# Patient Record
Sex: Female | Born: 2000 | Hispanic: Yes | Marital: Single | Smoking: Never smoker
Health system: Southern US, Community
[De-identification: ages and names within clinical notes are randomized; demographics above are authoritative.]

## PROBLEM LIST (undated history)

## (undated) DIAGNOSIS — J45909 Unspecified asthma, uncomplicated: Secondary | ICD-10-CM

---
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
## 2019-11-08 ENCOUNTER — Encounter: Payer: Self-pay | Admitting: Emergency Medicine

## 2019-11-08 ENCOUNTER — Other Ambulatory Visit: Payer: Self-pay

## 2019-11-08 ENCOUNTER — Emergency Department: Payer: Medicaid Other

## 2019-11-08 ENCOUNTER — Inpatient Hospital Stay
Admission: EM | Admit: 2019-11-08 | Discharge: 2019-11-13 | DRG: 871 | Discharge status: Against Medical Advice | Payer: Medicaid Other | Attending: Internal Medicine | Admitting: Internal Medicine

## 2019-11-08 DIAGNOSIS — N920 Excessive and frequent menstruation with regular cycle: Secondary | ICD-10-CM | POA: Diagnosis present

## 2019-11-08 DIAGNOSIS — Z20828 Contact with and (suspected) exposure to other viral communicable diseases: Secondary | ICD-10-CM | POA: Diagnosis present

## 2019-11-08 DIAGNOSIS — E876 Hypokalemia: Secondary | ICD-10-CM | POA: Diagnosis present

## 2019-11-08 DIAGNOSIS — D509 Iron deficiency anemia, unspecified: Secondary | ICD-10-CM | POA: Diagnosis present

## 2019-11-08 DIAGNOSIS — R748 Abnormal levels of other serum enzymes: Secondary | ICD-10-CM | POA: Diagnosis present

## 2019-11-08 DIAGNOSIS — A084 Viral intestinal infection, unspecified: Secondary | ICD-10-CM | POA: Diagnosis present

## 2019-11-08 DIAGNOSIS — J189 Pneumonia, unspecified organism: Secondary | ICD-10-CM | POA: Diagnosis present

## 2019-11-08 DIAGNOSIS — A419 Sepsis, unspecified organism: Secondary | ICD-10-CM | POA: Diagnosis not present

## 2019-11-08 DIAGNOSIS — R197 Diarrhea, unspecified: Secondary | ICD-10-CM

## 2019-11-08 DIAGNOSIS — Z833 Family history of diabetes mellitus: Secondary | ICD-10-CM

## 2019-11-08 DIAGNOSIS — Z5329 Procedure and treatment not carried out because of patient's decision for other reasons: Secondary | ICD-10-CM | POA: Diagnosis not present

## 2019-11-08 DIAGNOSIS — R509 Fever, unspecified: Secondary | ICD-10-CM

## 2019-11-08 DIAGNOSIS — R7401 Elevation of levels of liver transaminase levels: Secondary | ICD-10-CM | POA: Diagnosis present

## 2019-11-08 HISTORY — DX: Unspecified asthma, uncomplicated: J45.909

## 2019-11-08 LAB — CBC
HCT: 37.3 % (ref 36.0–46.0)
Hemoglobin: 12 g/dL (ref 12.0–15.0)
MCH: 25 pg — ABNORMAL LOW (ref 26.0–34.0)
MCHC: 32.2 g/dL (ref 30.0–36.0)
MCV: 77.7 fL — ABNORMAL LOW (ref 80.0–100.0)
Platelets: 356 10*3/uL (ref 150–400)
RBC: 4.8 MIL/uL (ref 3.87–5.11)
RDW: 13.3 % (ref 11.5–15.5)
WBC: 15.6 10*3/uL — ABNORMAL HIGH (ref 4.0–10.5)
nRBC: 0 % (ref 0.0–0.2)

## 2019-11-08 LAB — URINALYSIS, COMPLETE (UACMP) WITH MICROSCOPIC
Bacteria, UA: NONE SEEN
Bilirubin Urine: NEGATIVE
Glucose, UA: NEGATIVE mg/dL
Hgb urine dipstick: NEGATIVE
Ketones, ur: 5 mg/dL — AB
Leukocytes,Ua: NEGATIVE
Nitrite: NEGATIVE
Protein, ur: 100 mg/dL — AB
Specific Gravity, Urine: 1.028 (ref 1.005–1.030)
pH: 6 (ref 5.0–8.0)

## 2019-11-08 LAB — COMPREHENSIVE METABOLIC PANEL
ALT: 215 U/L — ABNORMAL HIGH (ref 0–44)
AST: 251 U/L — ABNORMAL HIGH (ref 15–41)
Albumin: 4 g/dL (ref 3.5–5.0)
Alkaline Phosphatase: 181 U/L — ABNORMAL HIGH (ref 38–126)
Anion gap: 12 (ref 5–15)
BUN: 6 mg/dL (ref 6–20)
CO2: 24 mmol/L (ref 22–32)
Calcium: 9.4 mg/dL (ref 8.9–10.3)
Chloride: 99 mmol/L (ref 98–111)
Creatinine, Ser: 0.58 mg/dL (ref 0.44–1.00)
GFR calc Af Amer: >60 mL/min (ref >60)
GFR calc non Af Amer: >60 mL/min (ref >60)
Glucose, Bld: 151 mg/dL — ABNORMAL HIGH (ref 70–99)
Potassium: 2.9 mmol/L — ABNORMAL LOW (ref 3.5–5.1)
Sodium: 135 mmol/L (ref 135–145)
Total Bilirubin: 1.5 mg/dL — ABNORMAL HIGH (ref 0.3–1.2)
Total Protein: 8.8 g/dL — ABNORMAL HIGH (ref 6.5–8.1)

## 2019-11-08 LAB — POC SARS CORONAVIRUS 2 AG
SARS Coronavirus 2 Ag: NEGATIVE
SARS Coronavirus 2 Ag: NEGATIVE

## 2019-11-08 LAB — ACETAMINOPHEN LEVEL: Acetaminophen (Tylenol), Serum: 31 ug/mL — ABNORMAL HIGH (ref 10–30)

## 2019-11-08 LAB — MONONUCLEOSIS SCREEN: Mono Screen: NEGATIVE

## 2019-11-08 LAB — LACTIC ACID, PLASMA: Lactic Acid, Venous: 0.9 mmol/L (ref 0.5–1.9)

## 2019-11-08 LAB — LIPASE, BLOOD: Lipase: 37 U/L (ref 11–51)

## 2019-11-08 LAB — PREGNANCY, URINE: Preg Test, Ur: NEGATIVE

## 2019-11-08 MED ORDER — ACETAMINOPHEN 500 MG PO TABS
1000.0000 mg | ORAL_TABLET | Freq: Once | ORAL | Status: AC
  Administered 2019-11-08: 1000 mg via ORAL
  Filled 2019-11-08: qty 2

## 2019-11-08 MED ORDER — IOHEXOL 300 MG/ML  SOLN
75.0000 mL | Freq: Once | INTRAMUSCULAR | Status: AC | PRN
  Administered 2019-11-08: 75 mL via INTRAVENOUS

## 2019-11-08 MED ORDER — SODIUM CHLORIDE 0.9% FLUSH: 3.0000 mL | Freq: Once | INTRAVENOUS | Status: DC

## 2019-11-08 MED ORDER — SODIUM CHLORIDE 0.9 % IV BOLUS
2000.0000 mL | Freq: Once | INTRAVENOUS | Status: AC
  Administered 2019-11-08: 2000 mL via INTRAVENOUS

## 2019-11-08 MED ORDER — PIPERACILLIN-TAZOBACTAM 3.375 G IVPB 30 MIN
3.3750 g | Freq: Once | INTRAVENOUS | Status: AC
  Administered 2019-11-08: 3.375 g via INTRAVENOUS
  Filled 2019-11-08: qty 50

## 2019-11-08 NOTE — ED Notes (Signed)
2 sets of blood cultures, grey top on ice, and covid swab walked to lab

## 2019-11-08 NOTE — H&P (Signed)
History and Physical  Patient Name: Jacqueline [name]     URK:[id number]    DOB: December 23, 2000    DOA: 11/08/2019 PCP: Patient, No Pcp Per  Patient coming from: Home  Chief Complaint: Fever      HPI: Jacqueline [name] is a 18 y.o. F with no sig PMHx who presents with 1 week fever and abdominal discomfort.  Patient was in her usual state of health until 10 days ago when she had onset of malaise, fever, and bloating generalized abdominal discomfort (she calls this "constipation" although each night for the last several days, she takes garlic and has several watery BMs).  She has had no dysuria, urinary frequency, urgency, hematuria.  She has had no vomiting. No hematochezia.  Possible Covid contact at work, she is unsure, she did test negative for Covid the day her symptoms started. She denies cough, chest congestion, coryza, dyspnea, but also states that she has been taking NyQuil nightly for the last week.    In the ER, febrile to 102.15F on arrival, heart rate 143, blood pressure normal.  K2.9, creatinine normal.  AST and ALT to 51-15, total bilirubin 1.5.  WBC 15 point 6K, lipase normal.  Urinalysis unremarkable.  Pregnancy test negative.  Covid antigen negative.  Acetaminophen 31, but this was after being administered Tylenol in the ER (and she reports only small amount of acetaminophen and nightly NyQuil).    Chest x-ray clear, ultrasound of the liver unremarkable, CT of the abdomen and pelvis with contrast showed normal gallbladder, normal pancreas, scattered adenitis only.  No appendicitis, no constipation, no colitis.  Of note it did show patchy groundglass opacities in the imaged lung bases.  She was given empiric Zosyn for colitis and IV fluids and the hospitalist service were asked to evaluate.          ROS: Review of Systems  Constitutional: Positive for fever and malaise/fatigue. Negative for weight loss.  Respiratory: Negative for cough, hemoptysis, sputum  production, shortness of breath and wheezing.   Gastrointestinal: Positive for abdominal pain and diarrhea. Negative for blood in stool, constipation, nausea and vomiting.  Genitourinary: Negative for dysuria, flank pain, frequency, hematuria and urgency.  Musculoskeletal: Negative for back pain, falls, joint pain, myalgias and neck pain.  All other systems reviewed and are negative.         Past Medical History:  Diagnosis Date   Asthma     History reviewed. No pertinent surgical history.  Social History: Patient lives with her mother.  The patient walks unassisted.  She works at [employer].  Nonsmoker.  No alcohol.  Allergies  Allergen Reactions   Avocado Other (See Comments)    Tongue swelling     Family history: family history includes Diabetes in her mother; Pancreatitis in her mother.  Prior to Admission medications   Not on File       Physical Exam: BP 135/84    Pulse (!) 112    Temp 98.9 F (37.2 C) (Oral)    Resp (!) 22    Ht 4\' 11"  (1.499 m)    Wt 52.2 kg    LMP 10/29/2019    SpO2 99%    BMI 23.23 kg/m  General appearance: Well-developed, adult female, alert and in no acute distress.   Eyes: Anicteric, conjunctiva pink, lids and lashes normal. PERRL.    ENT: No nasal deformity, discharge, epistaxis.  Hearing normal. OP moist without lesions.   Neck: No neck masses.  Trachea  midline.  No thyromegaly/tenderness. Lymph: No cervical or supraclavicular lymphadenopathy. Skin: Warm and dry.  No jaundice.  No suspicious rashes or lesions. Cardiac: Tachycardic, regular, nl S1-S2, no murmurs appreciated.  Capillary refill is brisk.  JVP normal.  No LE edema.  Radial pulses 2+ and symmetric. Respiratory: Normal respiratory rate and rhythm.  CTAB without rales or wheezes. Abdomen: Abdomen soft.  No TTP or guarding in all 4 quadrants, no rigidity or rebound. No ascites, distension, hepatosplenomegaly.   MSK: No deformities or effusions of the large joints of the upper  or lower extremities bilaterally.  No cyanosis or clubbing. Neuro: Cranial nerves normal.  Sensation intact to light touch. Speech is fluent.  Muscle strength normal.    Psych: Sensorium intact and responding to questions, attention normal.  Behavior appropriate.  Affect guarded.  Judgment and insight appear normal.     Labs on Admission:  I have personally reviewed following labs and imaging studies: CBC: Recent Labs  Lab 11/08/19 1840  WBC 15.6*  HGB 12.0  HCT 37.3  MCV 77.7*  PLT 356   Basic Metabolic Panel: Recent Labs  Lab 11/08/19 1840  NA 135  K 2.9*  CL 99  CO2 24  GLUCOSE 151*  BUN 6  CREATININE 0.58  CALCIUM 9.4   GFR: Estimated Creatinine Clearance: 83.6 mL/min (by C-G formula based on SCr of 0.58 mg/dL).  Liver Function Tests: Recent Labs  Lab 11/08/19 1840  AST 251*  ALT 215*  ALKPHOS 181*  BILITOT 1.5*  PROT 8.8*  ALBUMIN 4.0   Recent Labs  Lab 11/08/19 1840  LIPASE 37    Sepsis Labs: Lactic acid 0.9           Radiological Exams on Admission: Personally reviewed CT abdomen and pelvis report, and ultrasound abdomen report, described above: Ct Abdomen Pelvis W Contrast  Result Date: 11/08/2019 CLINICAL DATA:  Fevers EXAM: CT ABDOMEN AND PELVIS WITH CONTRAST TECHNIQUE: Multidetector CT imaging of the abdomen and pelvis was performed using the standard protocol following bolus administration of intravenous contrast. CONTRAST:  75mL OMNIPAQUE IOHEXOL 300 MG/ML  SOLN COMPARISON:  None. FINDINGS: Lower chest: Lung bases demonstrate minimal dependent atelectatic changes. Additionally in the left lung base there is some patchy ground-glass density. This could represent atypical pneumonia. Hepatobiliary: No focal liver abnormality is seen. No gallstones, gallbladder wall thickening, or biliary dilatation. Pancreas: Unremarkable. No pancreatic ductal dilatation or surrounding inflammatory changes. Spleen: Normal in size without focal abnormality.  Adrenals/Urinary Tract: Adrenal glands are within normal limits. The kidneys are well visualized bilaterally. No renal calculi or obstructive changes are noted. The bladder is well distended. Stomach/Bowel: Colon is decompressed without inflammatory change. The appendix is air-filled and within normal limits. No inflammatory changes are seen. Small bowel and stomach are within normal limits. Vascular/Lymphatic: No vascular abnormality is seen. Scattered small right lower quadrant lymph nodes are noted which could be related to mesenteric adenitis. None of these measure over 1 cm in short axis. Reproductive: Uterus and bilateral adnexa are unremarkable. Other: No abdominal wall hernia or abnormality. No abdominopelvic ascites. Musculoskeletal: No acute or significant osseous findings. IMPRESSION: Normal-appearing appendix. Patchy ground-glass density in the left lung base which could represent atypical pneumonia. Correlation with COVID-19 testing is recommended. Scattered right lower quadrant lymphadenopathy which may be related to mesenteric adenitis. Electronically Signed   By: Alcide CleverMark  Lukens M.D.   On: 11/08/2019 21:21   Dg Chest Port 1 View  Result Date: 11/08/2019 CLINICAL DATA:  FEVER EXAM: PORTABLE  CHEST 1 VIEW COMPARISON:  07/18/2008 FINDINGS: The heart size and mediastinal contours are within normal limits. Both lungs are clear. The visualized skeletal structures are unremarkable. IMPRESSION: No active disease. Electronically Signed   By: Katherine Mantle M.D.   On: 11/08/2019 19:36   US Abdomen Limited Ruq  Result Date: 11/08/2019 CLINICAL DATA:  Right upper quadrant pain with fever for 2 weeks. EXAM: ULTRASOUND ABDOMEN LIMITED RIGHT UPPER QUADRANT COMPARISON:  None. FINDINGS: Gallbladder: No gallstones or wall thickening visualized. No sonographic Murphy sign noted by sonographer. Common bile duct: Diameter: 2 mm Liver: No focal lesion identified. Within normal limits in parenchymal echogenicity.  Portal vein is patent on color Doppler imaging with normal direction of blood flow towards the liver. Other: None. IMPRESSION: Normal right upper quadrant ultrasound. Electronically Signed   By: Amie Portland M.D.   On: 11/08/2019 20:23            Assessment/Plan  Fever Transaminitis Cause unclear.  Patient has low health literacy or is just being guarded, I am unsure: history challenging (ie no "cold symptoms" but taking NyQuil, "feels constipated" but actually has loose stools).  I think cholangitis, cholecystitis, appendicitis are ruled out by exam and imaging.  She may have a viral infection, but I do not believe sepsis.    Her only localizing features are abdominal discomfort, loose stools, transaminitis, and adenitis on imaging.    -Hold antibiotics -Check HIV -Check viral hepatitis serologies  -If these negative, will need GI consultation for further elucidation of LFTs    Atypical pneumonia on CT  Lung fields captured on CT abdomen show atypical pneumonia. Antigen test in ER negative, but given fever, diarrhea, possible exposure to COVID at work, and atypical pneumonia  -Follow COVID PCR  -Keep as PUI for now      Hypokalemia -Check magnesium -Supplement potassium      DVT prophylaxis: Lovenox  Code Status: FULL  Family Communication:   Disposition Plan: Anticipate IV fluids, follow LFTs and serial abdominal exams and hepatitis serologies Consults called:  Admission status: Inpatient     Medical decision making: Patient seen at 10:54 PM on 11/08/2019.  The patient was discussed with Dr. Mort Sawyers.  What exists of the patient's chart was reviewed in depth and summarized above.  Clinical condition: Stable.        Earl Lites Dashea Mcmullan [facility] Please page though AMION or Epic secure chat:  For password, contact charge nurse

## 2019-11-08 NOTE — ED Provider Notes (Signed)
[facility] Emergency Department Provider Note  ____________________________________________   First MD Initiated Contact with Patient 11/08/19 1857     (approximate)  I have reviewed the triage vital signs and the nursing notes.  History  Chief Complaint Fever, Constipation, and Abdominal Pain    HPI Jacqueline [name] is a 18 y.o. female who presents emergency department for intermittent fevers for approximately 1 week.  She states she did have a possible COVID exposure at work, but was subsequently tested and reportedly negative.  She reports some associated lower abdominal discomfort.  She reports prior issues with constipation, however since her mother has been giving her garlic she has been having a bowel movement daily.  Last BM was today.  She denies any diarrhea.  She denies any urinary symptoms, dysuria, hematuria.  She denies any vaginal discharge.  She denies any chest pain, shortness of breath, difficulty breathing.  She has been taking nightly NyQuil for her symptoms, otherwise no other medications, no heavy Tylenol use.   Past Medical Hx Past Medical History:  Diagnosis Date  . Asthma     Problem List There are no active problems to display for this patient.   Past Surgical Hx History reviewed. No pertinent surgical history.  Medications Prior to Admission medications   Not on File    Allergies Avocado  Family Hx No family history on file.  Social Hx Social History   Tobacco Use  . Smoking status: Never Smoker  . Smokeless tobacco: Never Used  Substance Use Topics  . Alcohol use: Not Currently  . Drug use: Not Currently     Review of Systems  Constitutional: + fever Eyes: Negative for visual changes. ENT: Negative for sore throat. Cardiovascular: Negative for chest pain. Respiratory: Negative for shortness of breath. Gastrointestinal: Negative for nausea, vomiting.  Genitourinary: Negative for dysuria.  Musculoskeletal: Negative for leg swelling. Skin: Negative for rash. Neurological: Negative for for headaches.   Physical Exam  Vital Signs: ED Triage Vitals  Enc Vitals Group     BP 11/08/19 1835 136/82     Pulse Rate 11/08/19 1835 (!) 143     Resp 11/08/19 1835 (!) 22     Temp 11/08/19 1835 (!) 102.8 F (39.3 C)     Temp Source 11/08/19 1835 Oral     SpO2 11/08/19 1835 97 %     Weight 11/08/19 1836 115 lb (52.2 kg)     Height 11/08/19 1836 4\' 11"  (1.499 m)     Head Circumference --      Peak Flow --      Pain Score 11/08/19 1835 10     Pain Loc --      Pain Edu? --      Excl. in GC? --     Constitutional: Alert and oriented.  Head: Normocephalic. Atraumatic. Eyes: Conjunctivae clear. Sclera anicteric. Nose: No congestion. No rhinorrhea. Mouth/Throat: Wearing mask.  Neck: No stridor.   Cardiovascular: Tachycardic, regular rhythm. Extremities well perfused. Respiratory: RR low 20s. No hypoxia.  Speaking in full sentences.  Lungs CTAB. Gastrointestinal: Soft. Mild RUQ and lower abdominal tenderness. No rebound or guarding. Non-distended.  Musculoskeletal: No lower extremity edema. No deformities. Neurologic:  Normal speech and language. No gross focal neurologic deficits are appreciated.  Skin: Skin is warm, dry and intact. No rash noted. Psychiatric: Mood and affect are appropriate for situation.  EKG  Personally reviewed.   Rate: 130 Rhythm: sinus Axis: normal Intervals: WNL Non-specific ST/T wave  findings Sinus tachycardia  No STEMI    Radiology  CXR: IMPRESSION:  No active disease.   [place]: IMPRESSION:  Normal right upper quadrant ultrasound.   CT: IMPRESSION:  Normal-appearing appendix.   Patchy ground-glass density in the left lung base which could  represent atypical pneumonia. Correlation with COVID-19 testing is  recommended.   Scattered right lower quadrant lymphadenopathy which may be related  to mesenteric adenitis.    Procedures   Procedure(s) performed (including critical care):  .Critical Care Performed by: Lilia Pro., MD Authorized by: Lilia Pro., MD   Critical care provider statement:    Critical care time (minutes):  45   Critical care was necessary to treat or prevent imminent or life-threatening deterioration of the following conditions:  Sepsis   Critical care was time spent personally by me on the following activities:  Discussions with consultants, evaluation of patient's response to treatment, examination of patient, ordering and performing treatments and interventions, ordering and review of laboratory studies, ordering and review of radiographic studies, pulse oximetry, re-evaluation of patient's condition, obtaining history from patient or surrogate and review of old charts     Initial Impression / Assessment and Plan / ED Course  18 y.o. female who presents to the ED for fever, abdominal pain, possible COVID exposure.   On arrival, febrile and tachycardic  Ddx: COVID, colitis, chole, UTI  Will obtain labs, imaging  Labs reveal leukocytosis to 15.6.  Elevated AST, ALT to 251, 215 respectively.  Bilirubin 1.5.  Lipase negative.  Given this, will obtain right upper quadrant ultrasound, empirically treat with Zosyn given her leukocytosis + meeting SIRS criteria.  Ultrasound is negative.  Will obtain CT abdomen/pelvis given some lower abdominal discomfort to evaluate for potential colitis.  COVID antigen negative, will proceed with follow-up PCR. Mono negative.  CT scan is negative.  Unclear etiology of her lab abnormalities and fever at this point.  However, strong suspicion for COVID remains, especially given lung bases seen on CT scan with patchy groundglass densities, which could be suggestive of COVID.  We will plan to admit, discussed with hospitalist for admission.   Final Clinical Impression(s) / ED Diagnosis  Final diagnoses:  Fever       Note:  This document was prepared  using Dragon voice recognition software and may include unintentional dictation errors.   Lilia Pro., MD 11/09/19 [phone]

## 2019-11-08 NOTE — ED Notes (Signed)
Urine pregnancy ordered for lab to complete, lab informed

## 2019-11-08 NOTE — ED Notes (Signed)
Pt otf for imaging 

## 2019-11-08 NOTE — ED Notes (Signed)
ED TO INPATIENT HANDOFF REPORT  ED Nurse Name and Phone #: Shanda Bumps [phone]   S Name/Age/Gender Jacqueline [name] 18 y.o. female Room/Bed: ED04A/ED04A  Code Status   Code Status: Not on file  Home/SNF/Other Home Patient oriented to: self, place, time and situation Is this baseline? Yes   Triage Complete: Triage complete  Chief Complaint Abd pain  Triage Note Pt presents to ED via POV with c/o fever that started last week. Pt states had a fever last week that resolved but upon arrival it is unknown whether or not has fever, in triage patient noted to be febrile at 102.8. Pt states has negative Covid test. Pt also c/o abdominal pain, denies N/V/D, states she feels constipated.     Allergies Allergies  Allergen Reactions  . Avocado Other (See Comments)    Tongue swelling     Level of Care/Admitting Diagnosis ED Disposition    ED Disposition Condition Comment   Admit  Hospital Area: [facility] [[id number]]  Level of Care: Med-Surg [16]  Covid Evaluation: Symptomatic Person Under Investigation (PUI)  Diagnosis: Fever [344092]  Admitting Physician: Alberteen Sam [[id number]]  Attending Physician: Alberteen Sam [[id number]]  Estimated length of stay: past midnight tomorrow  Certification:: I certify this patient will need inpatient services for at least 2 midnights  PT Class (Do Not Modify): Inpatient [101]  PT Acc Code (Do Not Modify): Private [1]       B Medical/Surgery History Past Medical History:  Diagnosis Date  . Asthma    History reviewed. No pertinent surgical history.   A IV Location/Drains/Wounds Patient Lines/Drains/Airways Status   Active Line/Drains/Airways    Name:   Placement date:   Placement time:   Site:   Days:   Peripheral IV 11/08/19 Left Antecubital   11/08/19    1929    Antecubital   less than 1          Intake/Output Last 24 hours No intake or output data in the 24 hours ending 11/08/19  2333  Labs/Imaging Results for orders placed or performed during the hospital encounter of 11/08/19 (from the past 48 hour(s))  Lipase, blood     Status: None   Collection Time: 11/08/19  6:40 PM  Result Value Ref Range   Lipase 37 11 - 51 U/L    Comment: Performed at [facility], [address] [place], [place] [address]  Comprehensive metabolic panel     Status: Abnormal   Collection Time: 11/08/19  6:40 PM  Result Value Ref Range   Sodium 135 135 - 145 mmol/L   Potassium 2.9 (L) 3.5 - 5.1 mmol/L   Chloride 99 98 - 111 mmol/L   CO2 24 22 - 32 mmol/L   Glucose, Bld 151 (H) 70 - 99 mg/dL   BUN 6 6 - 20 mg/dL   Creatinine, Ser 6.44 0.44 - 1.00 mg/dL   Calcium 9.4 8.9 - 03.4 mg/dL   Total Protein 8.8 (H) 6.5 - 8.1 g/dL   Albumin 4.0 3.5 - 5.0 g/dL   AST 742 (H) 15 - 41 U/L   ALT 215 (H) 0 - 44 U/L   Alkaline Phosphatase 181 (H) 38 - 126 U/L   Total Bilirubin 1.5 (H) 0.3 - 1.2 mg/dL   GFR calc non Af Amer >60 >60 mL/min   GFR calc Af Amer >60 >60 mL/min   Anion gap 12 5 - 15    Comment: Performed at [facility], [address] [place], [place] [address]  CBC     Status: Abnormal   Collection Time: 11/08/19  6:40 PM  Result Value Ref Range   WBC 15.6 (H) 4.0 - 10.5 K/uL   RBC 4.80 3.87 - 5.11 MIL/uL   Hemoglobin 12.0 12.0 - 15.0 g/dL   HCT 60.437.3 54.036.0 - 98.146.0 %   MCV 77.7 (L) 80.0 - 100.0 fL   MCH 25.0 (L) 26.0 - 34.0 pg   MCHC 32.2 30.0 - 36.0 g/dL   RDW 19.113.3 47.811.5 - 29.515.5 %   Platelets 356 150 - 400 K/uL   nRBC 0.0 0.0 - 0.2 %    Comment: Performed at [facility], [address] [place], [place] [address]  Urinalysis, Complete w Microscopic     Status: Abnormal   Collection Time: 11/08/19  7:24 PM  Result Value Ref Range   Color, Urine AMBER (A) YELLOW    Comment: BIOCHEMICALS MAY BE AFFECTED BY COLOR   APPearance HAZY (A) CLEAR   Specific Gravity, Urine 1.028 1.005 - 1.030   pH 6.0 5.0 - 8.0   Glucose, UA NEGATIVE NEGATIVE mg/dL   Hgb  urine dipstick NEGATIVE NEGATIVE   Bilirubin Urine NEGATIVE NEGATIVE   Ketones, ur 5 (A) NEGATIVE mg/dL   Protein, ur 865100 (A) NEGATIVE mg/dL   Nitrite NEGATIVE NEGATIVE   Leukocytes,Ua NEGATIVE NEGATIVE   RBC / HPF 0-5 0 - 5 RBC/hpf   WBC, UA 0-5 0 - 5 WBC/hpf   Bacteria, UA NONE SEEN NONE SEEN   Squamous Epithelial / LPF 11-20 0 - 5   Mucus PRESENT     Comment: Performed at [facility], [address] [place], [place] [address]  Pregnancy, urine     Status: None   Collection Time: 11/08/19  7:24 PM  Result Value Ref Range   Preg Test, Ur NEGATIVE NEGATIVE    Comment: Performed at [facility], [address] [place], [place] [address]  POC SARS Coronavirus 2 Ag     Status: None   Collection Time: 11/08/19  7:55 PM  Result Value Ref Range   SARS Coronavirus 2 Ag NEGATIVE NEGATIVE    Comment: (NOTE) SARS-CoV-2 antigen NOT DETECTED.  Negative results are presumptive.  Negative results do not preclude SARS-CoV-2 infection and should not be used as the sole basis for treatment or other patient management decisions, including infection  control decisions, particularly in the presence of clinical signs and  symptoms consistent with COVID-19, or in those who have been in contact with the virus.  Negative results must be combined with clinical observations, patient history, and epidemiological information. The expected result is Negative. Fact Sheet for Patients: https://sanders-williams.net/https://www.fda.gov/media/139754/download Fact Sheet for Healthcare Providers: https://martinez.com/https://www.fda.gov/media/139753/download This test is not yet approved or cleared by the [place] FDA and  has been authorized for detection and/or diagnosis of SARS-CoV-2 by FDA under an Emergency Use Authorization (EUA).  This EUA will remain in effect (meaning this test can be used) for the duration of  the COVID-19 de claration under Section 564(b)(1) of the Act, 21 U.S.C. section 360bbb-3(b)(1), unless the  authorization is terminated or revoked sooner.   POC SARS Coronavirus 2 Ag     Status: None   Collection Time: 11/08/19  7:59 PM  Result Value Ref Range   SARS Coronavirus 2 Ag NEGATIVE NEGATIVE    Comment: (NOTE) SARS-CoV-2 antigen NOT DETECTED.  Negative results are presumptive.  Negative results do not preclude SARS-CoV-2 infection and should  not be used as the sole basis for treatment or other patient management decisions, including infection  control decisions, particularly in the presence of clinical signs and  symptoms consistent with COVID-19, or in those who have been in contact with the virus.  Negative results must be combined with clinical observations, patient history, and epidemiological information. The expected result is Negative. Fact Sheet for Patients: PodPark.tn Fact Sheet for Healthcare Providers: GiftContent.is This test is not yet approved or cleared by the [place] FDA and  has been authorized for detection and/or diagnosis of SARS-CoV-2 by FDA under an Emergency Use Authorization (EUA).  This EUA will remain in effect (meaning this test can be used) for the duration of  the COVID-19 de claration under Section 564(b)(1) of the Act, 21 U.S.C. section 360bbb-3(b)(1), unless the authorization is terminated or revoked sooner.   Acetaminophen level     Status: Abnormal   Collection Time: 11/08/19  8:29 PM  Result Value Ref Range   Acetaminophen (Tylenol), Serum 31 (H) 10 - 30 ug/mL    Comment: (NOTE) Therapeutic concentrations vary significantly. A range of 10-30 ug/mL  may be an effective concentration for many patients. However, some  are best treated at concentrations outside of this range. Acetaminophen concentrations >150 ug/mL at 4 hours after ingestion  and >50 ug/mL at 12 hours after ingestion are often associated with  toxic reactions. Performed at [facility], [address] [place], [place] [address]   Mononucleosis screen     Status: None   Collection Time: 11/08/19  8:29 PM  Result Value Ref Range   Mono Screen NEGATIVE NEGATIVE    Comment: Performed at [facility], [address] [place], [place] [address]  Lactic acid, plasma     Status: None   Collection Time: 11/08/19  9:00 PM  Result Value Ref Range   Lactic Acid, Venous 0.9 0.5 - 1.9 mmol/L    Comment: Performed at [facility], [address] [place], [place] [address]   Ct Abdomen Pelvis W Contrast  Result Date: 11/08/2019 CLINICAL DATA:  Fevers EXAM: CT ABDOMEN AND PELVIS WITH CONTRAST TECHNIQUE: Multidetector CT imaging of the abdomen and pelvis was performed using the standard protocol following bolus administration of intravenous contrast. CONTRAST:  36mL OMNIPAQUE IOHEXOL 300 MG/ML  SOLN COMPARISON:  None. FINDINGS: Lower chest: Lung bases demonstrate minimal dependent atelectatic changes. Additionally in the left lung base there is some patchy ground-glass density. This could represent atypical pneumonia. Hepatobiliary: No focal liver abnormality is seen. No gallstones, gallbladder wall thickening, or biliary dilatation. Pancreas: Unremarkable. No pancreatic ductal dilatation or surrounding inflammatory changes. Spleen: Normal in size without focal abnormality. Adrenals/Urinary Tract: Adrenal glands are within normal limits. The kidneys are well visualized bilaterally. No renal calculi or obstructive changes are noted. The bladder is well distended. Stomach/Bowel: Colon is decompressed without inflammatory change. The appendix is air-filled and within normal limits. No inflammatory changes are seen. Small bowel and stomach are within normal limits. Vascular/Lymphatic: No vascular abnormality is seen. Scattered small right lower quadrant lymph nodes are noted which could be related to mesenteric adenitis. None of these measure over 1 cm in short axis. Reproductive: Uterus and  bilateral adnexa are unremarkable. Other: No abdominal wall hernia or abnormality. No abdominopelvic ascites. Musculoskeletal: No acute or significant osseous findings. IMPRESSION: Normal-appearing appendix. Patchy ground-glass density in the left lung base which could represent atypical pneumonia. Correlation with COVID-19 testing is recommended. Scattered right lower quadrant lymphadenopathy which  may be related to mesenteric adenitis. Electronically Signed   By: Alcide Clever M.D.   On: 11/08/2019 21:21   Dg Chest Port 1 View  Result Date: 11/08/2019 CLINICAL DATA:  FEVER EXAM: PORTABLE CHEST 1 VIEW COMPARISON:  07/18/2008 FINDINGS: The heart size and mediastinal contours are within normal limits. Both lungs are clear. The visualized skeletal structures are unremarkable. IMPRESSION: No active disease. Electronically Signed   By: Katherine Mantle M.D.   On: 11/08/2019 19:36   US Abdomen Limited Ruq  Result Date: 11/08/2019 CLINICAL DATA:  Right upper quadrant pain with fever for 2 weeks. EXAM: ULTRASOUND ABDOMEN LIMITED RIGHT UPPER QUADRANT COMPARISON:  None. FINDINGS: Gallbladder: No gallstones or wall thickening visualized. No sonographic Murphy sign noted by sonographer. Common bile duct: Diameter: 2 mm Liver: No focal lesion identified. Within normal limits in parenchymal echogenicity. Portal vein is patent on color Doppler imaging with normal direction of blood flow towards the liver. Other: None. IMPRESSION: Normal right upper quadrant ultrasound. Electronically Signed   By: Amie Portland M.D.   On: 11/08/2019 20:23    Pending Labs Unresulted Labs (From admission, onward)    Start     Ordered   11/08/19 2219  Urine Culture  Add-on,   AD     11/08/19 2218   11/08/19 2033  SARS CORONAVIRUS 2 (TAT 6-24 HRS) Nasopharyngeal Nasopharyngeal Swab  (Asymptomatic/Tier 3)  ONCE - STAT,   STAT    Question Answer Comment  Is this test for diagnosis or screening Screening   Symptomatic for COVID-19  as defined by CDC No   Hospitalized for COVID-19 No   Admitted to ICU for COVID-19 No   Previously tested for COVID-19 Yes   Resident in a congregate (group) care setting No   Employed in healthcare setting No   Pregnant No      11/08/19 2032   11/08/19 2031  Lactic acid, plasma  Now then every 2 hours,   STAT     11/08/19 2031   11/08/19 2031  Culture, blood (routine x 2)  BLOOD CULTURE X 2,   STAT     11/08/19 2031          Vitals/Pain Today's Vitals   11/08/19 1835 11/08/19 1836 11/08/19 1933 11/08/19 2153  BP: 136/82  135/84   Pulse: (!) 143  (!) 112   Resp: (!) 22     Temp: (!) 102.8 F (39.3 C)   98.9 F (37.2 C)  TempSrc: Oral   Oral  SpO2: 97%  99%   Weight:  52.2 kg    Height:  4\' 11"  (1.499 m)    PainSc: 10-Worst pain ever       Isolation Precautions Airborne and Contact precautions  Medications Medications  sodium chloride flush (NS) 0.9 % injection 3 mL (3 mLs Intravenous Not Given 11/08/19 1854)  sodium chloride 0.9 % bolus 2,000 mL (2,000 mLs Intravenous New Bag/Given 11/08/19 1931)  acetaminophen (TYLENOL) tablet 1,000 mg (1,000 mg Oral Given 11/08/19 1923)  piperacillin-tazobactam (ZOSYN) IVPB 3.375 g (0 g Intravenous Stopped 11/08/19 2224)  iohexol (OMNIPAQUE) 300 MG/ML solution 75 mL (75 mLs Intravenous Contrast Given 11/08/19 2049)    Mobility walks Low fall risk   Focused Assessments 1   R Recommendations: See Admitting Provider Note  Report given to:   Additional Notes:

## 2019-11-08 NOTE — ED Notes (Signed)
Ultrasound at bedside

## 2019-11-08 NOTE — ED Triage Notes (Signed)
Pt presents to ED via POV with c/o fever that started last week. Pt states had a fever last week that resolved but upon arrival it is unknown whether or not has fever, in triage patient noted to be febrile at 102.8. Pt states has negative Covid test. Pt also c/o abdominal pain, denies N/V/D, states she feels constipated.

## 2019-11-08 NOTE — ED Notes (Signed)
Red top tube sent to lab

## 2019-11-09 DIAGNOSIS — R945 Abnormal results of liver function studies: Secondary | ICD-10-CM

## 2019-11-09 DIAGNOSIS — J189 Pneumonia, unspecified organism: Secondary | ICD-10-CM

## 2019-11-09 DIAGNOSIS — R197 Diarrhea, unspecified: Secondary | ICD-10-CM

## 2019-11-09 DIAGNOSIS — E876 Hypokalemia: Secondary | ICD-10-CM

## 2019-11-09 LAB — CBC WITH DIFFERENTIAL/PLATELET
Abs Immature Granulocytes: 0.08 10*3/uL — ABNORMAL HIGH (ref 0.00–0.07)
Basophils Absolute: 0 10*3/uL (ref 0.0–0.1)
Basophils Relative: 0 %
Eosinophils Absolute: 0 10*3/uL (ref 0.0–0.5)
Eosinophils Relative: 0 %
HCT: 30.6 % — ABNORMAL LOW (ref 36.0–46.0)
Hemoglobin: 10.1 g/dL — ABNORMAL LOW (ref 12.0–15.0)
Immature Granulocytes: 1 %
Lymphocytes Relative: 8 %
Lymphs Abs: 1.2 10*3/uL (ref 0.7–4.0)
MCH: 24.8 pg — ABNORMAL LOW (ref 26.0–34.0)
MCHC: 33 g/dL (ref 30.0–36.0)
MCV: 75 fL — ABNORMAL LOW (ref 80.0–100.0)
Monocytes Absolute: 0.2 10*3/uL (ref 0.1–1.0)
Monocytes Relative: 2 %
Neutro Abs: 13.6 10*3/uL — ABNORMAL HIGH (ref 1.7–7.7)
Neutrophils Relative %: 89 %
Platelets: 317 10*3/uL (ref 150–400)
RBC: 4.08 MIL/uL (ref 3.87–5.11)
RDW: 13.8 % (ref 11.5–15.5)
WBC: 15.2 10*3/uL — ABNORMAL HIGH (ref 4.0–10.5)
nRBC: 0 % (ref 0.0–0.2)

## 2019-11-09 LAB — HEPATITIS PANEL, ACUTE
HCV Ab: NONREACTIVE
Hep A IgM: NONREACTIVE
Hep B C IgM: NONREACTIVE
Hepatitis B Surface Ag: NONREACTIVE

## 2019-11-09 LAB — BASIC METABOLIC PANEL
Anion gap: 13 (ref 5–15)
Anion gap: 10 (ref 5–15)
BUN: <5 mg/dL — ABNORMAL LOW (ref 6–20)
BUN: <5 mg/dL — ABNORMAL LOW (ref 6–20)
CO2: 22 mmol/L (ref 22–32)
CO2: 23 mmol/L (ref 22–32)
Calcium: 8.8 mg/dL — ABNORMAL LOW (ref 8.9–10.3)
Calcium: 8.7 mg/dL — ABNORMAL LOW (ref 8.9–10.3)
Chloride: 104 mmol/L (ref 98–111)
Chloride: 107 mmol/L (ref 98–111)
Creatinine, Ser: 0.45 mg/dL (ref 0.44–1.00)
Creatinine, Ser: 0.4 mg/dL — ABNORMAL LOW (ref 0.44–1.00)
GFR calc Af Amer: >60 mL/min (ref >60)
GFR calc Af Amer: >60 mL/min (ref >60)
GFR calc non Af Amer: >60 mL/min (ref >60)
GFR calc non Af Amer: >60 mL/min (ref >60)
Glucose, Bld: 109 mg/dL — ABNORMAL HIGH (ref 70–99)
Glucose, Bld: 105 mg/dL — ABNORMAL HIGH (ref 70–99)
Potassium: 2.7 mmol/L — CL (ref 3.5–5.1)
Potassium: 3.5 mmol/L (ref 3.5–5.1)
Sodium: 139 mmol/L (ref 135–145)
Sodium: 140 mmol/L (ref 135–145)

## 2019-11-09 LAB — HEPATIC FUNCTION PANEL
ALT: 176 U/L — ABNORMAL HIGH (ref 0–44)
AST: 126 U/L — ABNORMAL HIGH (ref 15–41)
Albumin: 4.1 g/dL (ref 3.5–5.0)
Alkaline Phosphatase: 176 U/L — ABNORMAL HIGH (ref 38–126)
Bilirubin, Direct: 0.4 mg/dL — ABNORMAL HIGH (ref 0.0–0.2)
Indirect Bilirubin: 1.3 mg/dL — ABNORMAL HIGH (ref 0.3–0.9)
Total Bilirubin: 1.7 mg/dL — ABNORMAL HIGH (ref 0.3–1.2)
Total Protein: 8.8 g/dL — ABNORMAL HIGH (ref 6.5–8.1)

## 2019-11-09 LAB — LACTIC ACID, PLASMA
Lactic Acid, Venous: 1 mmol/L (ref 0.5–1.9)
Lactic Acid, Venous: 1 mmol/L (ref 0.5–1.9)

## 2019-11-09 LAB — SARS CORONAVIRUS 2 (TAT 6-24 HRS): SARS Coronavirus 2: NEGATIVE

## 2019-11-09 LAB — IRON AND TIBC
Iron: 8 ug/dL — ABNORMAL LOW (ref 28–170)
Saturation Ratios: 3 % — ABNORMAL LOW (ref 10.4–31.8)
TIBC: 299 ug/dL (ref 250–450)
UIBC: 291 ug/dL

## 2019-11-09 LAB — CBC
HCT: 36.5 % (ref 36.0–46.0)
Hemoglobin: 11.7 g/dL — ABNORMAL LOW (ref 12.0–15.0)
MCH: 24.9 pg — ABNORMAL LOW (ref 26.0–34.0)
MCHC: 32.1 g/dL (ref 30.0–36.0)
MCV: 77.8 fL — ABNORMAL LOW (ref 80.0–100.0)
Platelets: 340 10*3/uL (ref 150–400)
RBC: 4.69 MIL/uL (ref 3.87–5.11)
RDW: 13.6 % (ref 11.5–15.5)
WBC: 14.7 10*3/uL — ABNORMAL HIGH (ref 4.0–10.5)
nRBC: 0 % (ref 0.0–0.2)

## 2019-11-09 LAB — URINE DRUG SCREEN, QUALITATIVE (ARMC ONLY)
Amphetamines, Ur Screen: NOT DETECTED
Barbiturates, Ur Screen: NOT DETECTED
Benzodiazepine, Ur Scrn: NOT DETECTED
Cannabinoid 50 Ng, Ur ~~LOC~~: POSITIVE — AB
Cocaine Metabolite,Ur ~~LOC~~: NOT DETECTED
MDMA (Ecstasy)Ur Screen: NOT DETECTED
Methadone Scn, Ur: NOT DETECTED
Opiate, Ur Screen: NOT DETECTED
Phencyclidine (PCP) Ur S: NOT DETECTED
Tricyclic, Ur Screen: NOT DETECTED

## 2019-11-09 LAB — VITAMIN B12: Vitamin B-12: 547 pg/mL (ref 180–914)

## 2019-11-09 LAB — MAGNESIUM
Magnesium: 1.9 mg/dL (ref 1.7–2.4)
Magnesium: 1.9 mg/dL (ref 1.7–2.4)

## 2019-11-09 LAB — FOLATE: Folate: 11.9 ng/mL (ref >5.9)

## 2019-11-09 LAB — HIV ANTIBODY (ROUTINE TESTING W REFLEX): HIV Screen 4th Generation wRfx: NONREACTIVE

## 2019-11-09 LAB — PROTIME-INR
INR: 1.1 (ref 0.8–1.2)
Prothrombin Time: 14.2 seconds (ref 11.4–15.2)

## 2019-11-09 MED ORDER — POTASSIUM CHLORIDE IN NACL 20-0.9 MEQ/L-% IV SOLN
INTRAVENOUS | Status: DC
  Administered 2019-11-09 – 2019-11-13 (×11): via INTRAVENOUS
  Filled 2019-11-09 (×17): qty 1000

## 2019-11-09 MED ORDER — PIPERACILLIN-TAZOBACTAM 3.375 G IVPB
3.3750 g | Freq: Three times a day (TID) | INTRAVENOUS | Status: DC
  Administered 2019-11-09 – 2019-11-11 (×5): 3.375 g via INTRAVENOUS
  Filled 2019-11-09 (×5): qty 50

## 2019-11-09 MED ORDER — ENOXAPARIN SODIUM 40 MG/0.4ML ~~LOC~~ SOLN
40.0000 mg | Freq: Every day | SUBCUTANEOUS | Status: DC
  Administered 2019-11-09: 40 mg via SUBCUTANEOUS
  Filled 2019-11-09 (×6): qty 0.4

## 2019-11-09 MED ORDER — POTASSIUM CHLORIDE CRYS ER 20 MEQ PO TBCR
40.0000 meq | EXTENDED_RELEASE_TABLET | Freq: Once | ORAL | Status: AC
  Administered 2019-11-09: 40 meq via ORAL
  Filled 2019-11-09: qty 2

## 2019-11-09 MED ORDER — LABETALOL HCL 5 MG/ML IV SOLN
10.0000 mg | Freq: Once | INTRAVENOUS | Status: AC
  Administered 2019-11-09: 10 mg via INTRAVENOUS
  Filled 2019-11-09: qty 4

## 2019-11-09 MED ORDER — ONDANSETRON HCL 4 MG/2ML IJ SOLN
4.0000 mg | Freq: Four times a day (QID) | INTRAMUSCULAR | Status: DC | PRN
  Administered 2019-11-09 – 2019-11-10 (×2): 4 mg via INTRAVENOUS
  Filled 2019-11-09 (×2): qty 2

## 2019-11-09 MED ORDER — IBUPROFEN 400 MG PO TABS
400.0000 mg | ORAL_TABLET | Freq: Four times a day (QID) | ORAL | Status: DC | PRN
  Administered 2019-11-09 – 2019-11-13 (×10): 400 mg via ORAL
  Filled 2019-11-09 (×10): qty 1

## 2019-11-09 MED ORDER — SODIUM CHLORIDE 0.9 % IV BOLUS
1000.0000 mL | Freq: Once | INTRAVENOUS | Status: AC
  Administered 2019-11-09: 1000 mL via INTRAVENOUS

## 2019-11-09 MED ORDER — ONDANSETRON HCL 4 MG PO TABS: 4.0000 mg | ORAL_TABLET | Freq: Four times a day (QID) | ORAL | Status: DC | PRN

## 2019-11-09 NOTE — Progress Notes (Signed)
Patients HR elevated and was called by Tele that it got as high as 140s. Stark Klein, NP on call notified. Also notified her of K of 2.7. I just gave her PO replacement and she is getting Fluids with K. Will recheck labs in morning.

## 2019-11-09 NOTE — Consult Note (Addendum)
Wyline Mood , MD [address], [place], [place], [address] [address], [place], [place], [address] Phone: [phone]  Fax: [phone]  Consultation  Referring Provider:  Dr Marylu Lund Primary Care Physician:  Patient, No Pcp Per Primary Gastroenterologist:  None          Reason for Consultation:     Abnormal liver function tests  Date of Admission:  11/08/2019 Date of Consultation:  11/09/2019         HPI:   Jacqueline [name] is a 18 y.o. female admitted with 1 week history of abdominal pain and fever.  In the ER she was febrile, tachycardic with leukocytosis.  Covid test was negative. She had a normal right upper quadrant ultrasound.  CT scan of the abdomen showed normal-appearing appendix, patchy groundglass density in the left lung base which could represent atypical pneumonia.  Scattered right lower quadrant lymphadenopathy which may be related to mesenteric adenitis.  Renal functions have been normal.  Acute hepatitis panel namely hepatitis B surface antigen, hepatitis C viral antibody, hepatitis A IgM and hepatitis B core IgM have been nonreactive.  On admission transaminases were elevated with AST of 251, ALT 215 and alkaline phosphatase of 181 and total bilirubin 1.7 today they have improved.  White cell count also has been trending down since yesterday.  I do note that the patient has a microcytic anemia.  HIV test has been negative.  She denies any new medications being commenced, herbal or OTC meds. No tylenol use , no illegal drug use of alcohol use.   Since coming into the hospital has had some diarrhea but says she feels better. No cough or shortness of breath. Feels well.   Past Medical History:  Diagnosis Date  . Asthma     History reviewed. No pertinent surgical history.  Prior to Admission medications   Not on File    Family History  Problem Relation Age of Onset  . Diabetes Mother   . Pancreatitis Mother      Social  History   Tobacco Use  . Smoking status: Never Smoker  . Smokeless tobacco: Never Used  Substance Use Topics  . Alcohol use: Not Currently  . Drug use: Not Currently    Allergies as of 11/08/2019 - Review Complete 11/08/2019  Allergen Reaction Noted  . Avocado Other (See Comments) 11/08/2019    Review of Systems:    All systems reviewed and negative except where noted in HPI.   Physical Exam:  Vital signs in last 24 hours: Temp:  [98.4 F (36.9 C)-102.8 F (39.3 C)] 98.4 F (36.9 C) (11/27 0752) Pulse Rate:  [98-143] 106 (11/27 0752) Resp:  [18-22] 20 (11/27 0752) BP: (117-138)/(70-91) 120/71 (11/27 0752) SpO2:  [95 %-99 %] 99 % (11/27 0752) Weight:  [52.2 kg] 52.2 kg (11/26 1836) Last BM Date: 11/09/19 General:   Pleasant, cooperative in NAD Head:  Normocephalic and atraumatic. Eyes:   No icterus.   Conjunctiva pink. PERRLA. Ears:  Normal auditory acuity. Neck:  Supple; no masses or thyroidomegaly Lungs: Respirations even and unlabored. Lungs clear to auscultation bilaterally.   No wheezes, crackles, or rhonchi.  Heart:  Regular rate and rhythm;  Without murmur, clicks, rubs or gallops Abdomen:  Soft, nondistended, nontender. Normal bowel sounds. No appreciable masses or hepatomegaly.  No rebound or guarding.  Neurologic:  Alert and oriented x3;  grossly normal neurologically. Skin:  Intact without significant lesions or rashes. Cervical Nodes:  No significant  cervical adenopathy. Psych:  Alert and cooperative. Normal affect.  LAB RESULTS: Recent Labs    11/08/19 1840 11/09/19 0128  WBC 15.6* 14.7*  HGB 12.0 11.7*  HCT 37.3 36.5  PLT 356 340   BMET Recent Labs    11/08/19 1840 11/09/19 0128 11/09/19 0619  NA 135 139 140  K 2.9* 2.7* 3.5  CL 99 104 107  CO2 24 22 23   GLUCOSE 151* 109* 105*  BUN 6 <5* <5*  CREATININE 0.58 0.45 0.40*  CALCIUM 9.4 8.8* 8.7*   LFT Recent Labs    11/09/19 0128  PROT 8.8*  ALBUMIN 4.1  AST 126*  ALT 176*  ALKPHOS  176*  BILITOT 1.7*  BILIDIR 0.4*  IBILI 1.3*   PT/INR No results for input(s): LABPROT, INR in the last 72 hours.  STUDIES: Ct Abdomen Pelvis W Contrast  Result Date: 11/08/2019 CLINICAL DATA:  Fevers EXAM: CT ABDOMEN AND PELVIS WITH CONTRAST TECHNIQUE: Multidetector CT imaging of the abdomen and pelvis was performed using the standard protocol following bolus administration of intravenous contrast. CONTRAST:  75mL OMNIPAQUE IOHEXOL 300 MG/ML  SOLN COMPARISON:  None. FINDINGS: Lower chest: Lung bases demonstrate minimal dependent atelectatic changes. Additionally in the left lung base there is some patchy ground-glass density. This could represent atypical pneumonia. Hepatobiliary: No focal liver abnormality is seen. No gallstones, gallbladder wall thickening, or biliary dilatation. Pancreas: Unremarkable. No pancreatic ductal dilatation or surrounding inflammatory changes. Spleen: Normal in size without focal abnormality. Adrenals/Urinary Tract: Adrenal glands are within normal limits. The kidneys are well visualized bilaterally. No renal calculi or obstructive changes are noted. The bladder is well distended. Stomach/Bowel: Colon is decompressed without inflammatory change. The appendix is air-filled and within normal limits. No inflammatory changes are seen. Small bowel and stomach are within normal limits. Vascular/Lymphatic: No vascular abnormality is seen. Scattered small right lower quadrant lymph nodes are noted which could be related to mesenteric adenitis. None of these measure over 1 cm in short axis. Reproductive: Uterus and bilateral adnexa are unremarkable. Other: No abdominal wall hernia or abnormality. No abdominopelvic ascites. Musculoskeletal: No acute or significant osseous findings. IMPRESSION: Normal-appearing appendix. Patchy ground-glass density in the left lung base which could represent atypical pneumonia. Correlation with COVID-19 testing is recommended. Scattered right lower  quadrant lymphadenopathy which may be related to mesenteric adenitis. Electronically Signed   By: Alcide CleverMark  Lukens M.D.   On: 11/08/2019 21:21   Dg Chest Port 1 View  Result Date: 11/08/2019 CLINICAL DATA:  FEVER EXAM: PORTABLE CHEST 1 VIEW COMPARISON:  07/18/2008 FINDINGS: The heart size and mediastinal contours are within normal limits. Both lungs are clear. The visualized skeletal structures are unremarkable. IMPRESSION: No active disease. Electronically Signed   By: Katherine Mantlehristopher  Green M.D.   On: 11/08/2019 19:36   [place] Abdomen Limited Ruq  Result Date: 11/08/2019 CLINICAL DATA:  Right upper quadrant pain with fever for 2 weeks. EXAM: ULTRASOUND ABDOMEN LIMITED RIGHT UPPER QUADRANT COMPARISON:  None. FINDINGS: Gallbladder: No gallstones or wall thickening visualized. No sonographic Murphy sign noted by sonographer. Common bile duct: Diameter: 2 mm Liver: No focal lesion identified. Within normal limits in parenchymal echogenicity. Portal vein is patent on color Doppler imaging with normal direction of blood flow towards the liver. Other: None. IMPRESSION: Normal right upper quadrant ultrasound. Electronically Signed   By: Amie Portlandavid  Ormond M.D.   On: 11/08/2019 20:23      Impression / Plan:   Jacqueline [name] is a 18 y.o. y/o female admitted  with a short history of fever, abdominal discomfort, leukocytosis all suggestive of an infectious process.  The LFTs were initially raised predominantly the transaminases which have been trending downwards indicating an acute process most likely either infectious or related to a toxin.  Plan 1.  Check PT/INR, continue to trend LFTs I expect him to return back to normal soon. 2.  Continue treatment for underlying sepsis.  Possible pneumonia might be the cause. 3.  If the trend does not improve tomorrow then we will check for other causes of acute hepatitis B, C, HIV, EBV CMV and HSV. 4.  Since the patient has microcytic anemia I would suggest we check her  iron studies.  Thank you for involving me in the care of this patient.      LOS: 1 day   Jonathon Bellows, MD  11/09/2019, 10:45 AM

## 2019-11-09 NOTE — Progress Notes (Signed)
Patient HR elevated in the 130's-140's MD notified, orders to increase IV fluids to 150 ml/hr.

## 2019-11-09 NOTE — Progress Notes (Addendum)
Patient c/o of weakness. Reports that she felt fine this better but now feels worse. Temperature 101.5, HR 130-150s, RR 24. MEWS of 6. Ibuprofen given. NP Ouma notified. NP at bedside for evaluation.   Update: Temperature 102.7 after ibuprofen. NP Ouma notified. NP to place orders for tylenol. HR unchanged after dose of labetalol, bolus currently infusing.

## 2019-11-09 NOTE — Progress Notes (Signed)
PROGRESS NOTE    Jacqueline [name]  KGY:[id number] DOB: 2001-07-10 DOA: 11/08/2019 PCP: Patient, No Pcp Per    Brief Narrative:  Jacqueline [name] is a 18 y.o. F with no sig PMHx who presents with 1 week fever and abdominal discomfort.  Covid test was negative.  In the ED she was febrile, tachycardic and with leukocytosis.  CT of abdomen showed normal appearing appendix, patchy groundglass density in the left lung base which could represent atypical pneumonia.  Scattered right lower quadrant lymphadenopathies related to mesenteric adenitis.  Acid was normal.Acute hepatitis panel namely hepatitis B surface antigen, hepatitis C viral antibody, hepatitis A IgM and hepatitis B core IgM have been nonreactive.  On admission transaminases were elevated with AST of 251, ALT 215 and alkaline phosphatase of 181 and total bilirubin 1.7.  HIV test has been negative.  Chest x-ray was clear, ultrasound liver was unremarkable.  She was started on empiric Zosyn for possible colitis and IV fluids and the hospitalist service was called for admission and evaluation.    Consultants:   GI  Procedures:  CT abdomen and pelvis on 11/08/2019 Normal-appearing appendix.  Patchy ground-glass density in the left lung base which could represent atypical pneumonia. Correlation with COVID-19 testing is recommended.  Scattered right lower quadrant lymphadenopathy which may be related to mesenteric adenitis.  Antimicrobials:   Zosyn   Subjective: Patient reports decreased appetite this a.m. could not eat her breakfast. Had diarrhea this am.  Denies any fever, chills, or any other symptoms.  She does report a place where she works all were tested for [employer] and was negative.  Objective: Vitals:   11/09/19 0423 11/09/19 0538 11/09/19 0554 11/09/19 0752  BP:  117/70  120/71  Pulse: (!) 109 (!) 104 98 (!) 106  Resp:  18  20  Temp:  98.6 F (37 C)  98.4 F (36.9 C)  TempSrc:  Oral    SpO2:   97%  99%  Weight:      Height:        Intake/Output Summary (Last 24 hours) at 11/09/2019 1248 Last data filed at 11/09/2019 0263 Gross per 24 hour  Intake 100 ml  Output 250 ml  Net -150 ml   Filed Weights   11/08/19 1836  Weight: 52.2 kg    Examination: , nad General exam: Appears calm and comfortable  Respiratory system: Clear to auscultation. Respiratory effort normal.  No wheeze rales rhonchi's  cardiovascular system: S1 & S2 heard, RRR. No JVD, murmurs, rubs, gallops or clicks.  Gastrointestinal system: Abdomen is nondistended, soft and nontender.  Normal bowel sounds heard. Central nervous system: Alert and oriented. No focal neurological deficits. Extremities: No edema Skin: Warm and dry Psychiatry: Judgement and insight appear normal. Mood & affect appropriate.     Data Reviewed: I have personally reviewed following labs and imaging studies  CBC: Recent Labs  Lab 11/08/19 1840 11/09/19 0128  WBC 15.6* 14.7*  HGB 12.0 11.7*  HCT 37.3 36.5  MCV 77.7* 77.8*  PLT 356 785   Basic Metabolic Panel: Recent Labs  Lab 11/08/19 1840 11/09/19 0128 11/09/19 0619  NA 135 139 140  K 2.9* 2.7* 3.5  CL 99 104 107  CO2 24 22 23   GLUCOSE 151* 109* 105*  BUN 6 <5* <5*  CREATININE 0.58 0.45 0.40*  CALCIUM 9.4 8.8* 8.7*  MG  --  1.9 1.9   GFR: Estimated Creatinine Clearance: 83.6 mL/min (A) (by C-G formula based on SCr  of 0.4 mg/dL (L)). Liver Function Tests: Recent Labs  Lab 11/08/19 1840 11/09/19 0128  AST 251* 126*  ALT 215* 176*  ALKPHOS 181* 176*  BILITOT 1.5* 1.7*  PROT 8.8* 8.8*  ALBUMIN 4.0 4.1   Recent Labs  Lab 11/08/19 1840  LIPASE 37   No results for input(s): AMMONIA in the last 168 hours. Coagulation Profile: Recent Labs  Lab 11/09/19 1136  INR 1.1   Cardiac Enzymes: No results for input(s): CKTOTAL, CKMB, CKMBINDEX, TROPONINI in the last 168 hours. BNP (last 3 results) No results for input(s): PROBNP in the last 8760  hours. HbA1C: No results for input(s): HGBA1C in the last 72 hours. CBG: No results for input(s): GLUCAP in the last 168 hours. Lipid Profile: No results for input(s): CHOL, HDL, LDLCALC, TRIG, CHOLHDL, LDLDIRECT in the last 72 hours. Thyroid Function Tests: No results for input(s): TSH, T4TOTAL, FREET4, T3FREE, THYROIDAB in the last 72 hours. Anemia Panel: Recent Labs    11/09/19 1136  FOLATE 11.9  TIBC 299  IRON 8*   Sepsis Labs: Recent Labs  Lab 11/08/19 2100 11/09/19 0128  LATICACIDVEN 0.9 1.0    Recent Results (from the past 240 hour(s))  Culture, blood (routine x 2)     Status: None (Preliminary result)   Collection Time: 11/08/19  9:00 PM   Specimen: BLOOD  Result Value Ref Range Status   Specimen Description BLOOD LEFT ARM  Final   Special Requests   Final    BOTTLES DRAWN AEROBIC AND ANAEROBIC Blood Culture adequate volume   Culture   Final    NO GROWTH < 12 HOURS Performed at [facility], [address] [place], [place] [address]    Report Status PENDING  Incomplete  SARS CORONAVIRUS 2 (TAT 6-24 HRS) Nasopharyngeal Nasopharyngeal Swab     Status: None   Collection Time: 11/08/19  9:00 PM   Specimen: Nasopharyngeal Swab  Result Value Ref Range Status   SARS Coronavirus 2 NEGATIVE NEGATIVE Final    Comment: (NOTE) SARS-CoV-2 target nucleic acids are NOT DETECTED. The SARS-CoV-2 RNA is generally detectable in upper and lower respiratory specimens during the acute phase of infection. Negative results do not preclude SARS-CoV-2 infection, do not rule out co-infections with other pathogens, and should not be used as the sole basis for treatment or other patient management decisions. Negative results must be combined with clinical observations, patient history, and epidemiological information. The expected result is Negative. Fact Sheet for Patients: HairSlick.no Fact Sheet for Healthcare  Providers: quierodirigir.com This test is not yet approved or cleared by the [place] FDA and  has been authorized for detection and/or diagnosis of SARS-CoV-2 by FDA under an Emergency Use Authorization (EUA). This EUA will remain  in effect (meaning this test can be used) for the duration of the COVID-19 declaration under Section 56 4(b)(1) of the Act, 21 U.S.C. section 360bbb-3(b)(1), unless the authorization is terminated or revoked sooner. Performed at [facility], [address], [place], [place] [address]   Culture, blood (routine x 2)     Status: None (Preliminary result)   Collection Time: 11/08/19  9:01 PM   Specimen: BLOOD  Result Value Ref Range Status   Specimen Description BLOOD LEFT ASSIST CONTROL  Final   Special Requests   Final    BOTTLES DRAWN AEROBIC AND ANAEROBIC Blood Culture adequate volume   Culture   Final    NO GROWTH < 12 HOURS Performed at [facility], [address] [place], [place] [address]    Report Status PENDING  Incomplete         Radiology Studies: Ct Abdomen Pelvis W Contrast  Result Date: 11/08/2019 CLINICAL DATA:  Fevers EXAM: CT ABDOMEN AND PELVIS WITH CONTRAST TECHNIQUE: Multidetector CT imaging of the abdomen and pelvis was performed using the standard protocol following bolus administration of intravenous contrast. CONTRAST:  54mL OMNIPAQUE IOHEXOL 300 MG/ML  SOLN COMPARISON:  None. FINDINGS: Lower chest: Lung bases demonstrate minimal dependent atelectatic changes. Additionally in the left lung base there is some patchy ground-glass density. This could represent atypical pneumonia. Hepatobiliary: No focal liver abnormality is seen. No gallstones, gallbladder wall thickening, or biliary dilatation. Pancreas: Unremarkable. No pancreatic ductal dilatation or surrounding inflammatory changes. Spleen: Normal in size without focal abnormality. Adrenals/Urinary Tract: Adrenal glands are within  normal limits. The kidneys are well visualized bilaterally. No renal calculi or obstructive changes are noted. The bladder is well distended. Stomach/Bowel: Colon is decompressed without inflammatory change. The appendix is air-filled and within normal limits. No inflammatory changes are seen. Small bowel and stomach are within normal limits. Vascular/Lymphatic: No vascular abnormality is seen. Scattered small right lower quadrant lymph nodes are noted which could be related to mesenteric adenitis. None of these measure over 1 cm in short axis. Reproductive: Uterus and bilateral adnexa are unremarkable. Other: No abdominal wall hernia or abnormality. No abdominopelvic ascites. Musculoskeletal: No acute or significant osseous findings. IMPRESSION: Normal-appearing appendix. Patchy ground-glass density in the left lung base which could represent atypical pneumonia. Correlation with COVID-19 testing is recommended. Scattered right lower quadrant lymphadenopathy which may be related to mesenteric adenitis. Electronically Signed   By: Alcide Clever M.D.   On: 11/08/2019 21:21   Dg Chest Port 1 View  Result Date: 11/08/2019 CLINICAL DATA:  FEVER EXAM: PORTABLE CHEST 1 VIEW COMPARISON:  07/18/2008 FINDINGS: The heart size and mediastinal contours are within normal limits. Both lungs are clear. The visualized skeletal structures are unremarkable. IMPRESSION: No active disease. Electronically Signed   By: Katherine Mantle M.D.   On: 11/08/2019 19:36   US Abdomen Limited Ruq  Result Date: 11/08/2019 CLINICAL DATA:  Right upper quadrant pain with fever for 2 weeks. EXAM: ULTRASOUND ABDOMEN LIMITED RIGHT UPPER QUADRANT COMPARISON:  None. FINDINGS: Gallbladder: No gallstones or wall thickening visualized. No sonographic Murphy sign noted by sonographer. Common bile duct: Diameter: 2 mm Liver: No focal lesion identified. Within normal limits in parenchymal echogenicity. Portal vein is patent on color Doppler imaging  with normal direction of blood flow towards the liver. Other: None. IMPRESSION: Normal right upper quadrant ultrasound. Electronically Signed   By: Amie Portland M.D.   On: 11/08/2019 20:23        Scheduled Meds:  enoxaparin (LOVENOX) injection  40 mg Subcutaneous QHS   Continuous Infusions:  0.9 % NaCl with KCl 20 mEq / L 100 mL/hr at 11/09/19 1227    Assessment & Plan:   Principal Problem:   Fever Active Problems:   Atypical pneumonia   Transaminitis   Fever- etiology unclear. Possibly due to atypic pna. Pending, UA culture negative so far. Will empirically treat with Zosyn for now.  Transaminitis Etiology unclear.  Possibly due to viral.  GI consulted and recommend continue treatment for underlying infection as above. Possibly may be the cause. Per GI If the trend does not improve tomorrow then we will check for other causes of acute hepatitis B, C, HIV, EBV CMV and HSV.  Atypical pneumonia on CT  covid test neg.  Per pt everyone at her job was tested also and was negative Will continue zosyn for now    Hypokalemia Replaced and improved Continue to monitor  Diarrhea Will monitor if increases will send for c.diff and stool cx.  Possibly 2/2 viral gastroenteritis.   DVT prophylaxis: lovenox Code Status: Full Family Communication: None at bedside Disposition Plan: Possible DC in a.m. if clinically stable       LOS: 1 day   Time spent: 45 minutes with more than 50% on COC    Lynn ItoSahar Gunther Zawadzki, MD [facility] Pager [phone]  If 7PM-7AM, please contact night-coverage [email or web address] Password [email or web address] 11/09/2019, 12:48 PM

## 2019-11-09 NOTE — Plan of Care (Signed)
  Problem: Education: Goal: Knowledge of [facility] Education information/materials will improve Outcome: Progressing Goal: Emotional status will improve Outcome: Progressing Goal: Mental status will improve Outcome: Progressing Goal: Verbalization of understanding the information provided will improve Outcome: Progressing   Problem: Activity: Goal: Interest or engagement in activities will improve Outcome: Progressing Goal: Sleeping patterns will improve Outcome: Progressing   Problem: Coping: Goal: Ability to verbalize frustrations and anger appropriately will improve Outcome: Progressing Goal: Ability to demonstrate self-control will improve Outcome: Progressing   Problem: Health Behavior/Discharge Planning: Goal: Identification of resources available to assist in meeting health care needs will improve Outcome: Progressing Goal: Compliance with treatment plan for underlying cause of condition will improve Outcome: Progressing   Problem: Physical Regulation: Goal: Ability to maintain clinical measurements within normal limits will improve Outcome: Progressing   Problem: Safety: Goal: Periods of time without injury will increase Outcome: Progressing   

## 2019-11-09 NOTE — ED Notes (Signed)
Report given to telemetry RN 

## 2019-11-10 ENCOUNTER — Inpatient Hospital Stay (HOSPITAL_COMMUNITY)
Admit: 2019-11-10 | Discharge: 2019-11-10 | Disposition: A | Discharge status: Home / Self Care | Payer: Medicaid Other | Attending: Internal Medicine | Admitting: Internal Medicine

## 2019-11-10 DIAGNOSIS — R9431 Abnormal electrocardiogram [ECG] [EKG]: Secondary | ICD-10-CM

## 2019-11-10 DIAGNOSIS — R7401 Elevation of levels of liver transaminase levels: Secondary | ICD-10-CM

## 2019-11-10 LAB — ECHOCARDIOGRAM COMPLETE
Height: 59 in
Weight: 1766.4 oz

## 2019-11-10 LAB — CBC
HCT: 29.5 % — ABNORMAL LOW (ref 36.0–46.0)
Hemoglobin: 9.4 g/dL — ABNORMAL LOW (ref 12.0–15.0)
MCH: 24.8 pg — ABNORMAL LOW (ref 26.0–34.0)
MCHC: 31.9 g/dL (ref 30.0–36.0)
MCV: 77.8 fL — ABNORMAL LOW (ref 80.0–100.0)
Platelets: 300 10*3/uL (ref 150–400)
RBC: 3.79 MIL/uL — ABNORMAL LOW (ref 3.87–5.11)
RDW: 13.8 % (ref 11.5–15.5)
WBC: 15.7 10*3/uL — ABNORMAL HIGH (ref 4.0–10.5)
nRBC: 0 % (ref 0.0–0.2)

## 2019-11-10 LAB — COMPREHENSIVE METABOLIC PANEL
ALT: 114 U/L — ABNORMAL HIGH (ref 0–44)
AST: 78 U/L — ABNORMAL HIGH (ref 15–41)
Albumin: 3.1 g/dL — ABNORMAL LOW (ref 3.5–5.0)
Alkaline Phosphatase: 137 U/L — ABNORMAL HIGH (ref 38–126)
Anion gap: 10 (ref 5–15)
BUN: <5 mg/dL — ABNORMAL LOW (ref 6–20)
CO2: 20 mmol/L — ABNORMAL LOW (ref 22–32)
Calcium: 8.6 mg/dL — ABNORMAL LOW (ref 8.9–10.3)
Chloride: 109 mmol/L (ref 98–111)
Creatinine, Ser: 0.49 mg/dL (ref 0.44–1.00)
GFR calc Af Amer: >60 mL/min (ref >60)
GFR calc non Af Amer: >60 mL/min (ref >60)
Glucose, Bld: 142 mg/dL — ABNORMAL HIGH (ref 70–99)
Potassium: 3.3 mmol/L — ABNORMAL LOW (ref 3.5–5.1)
Sodium: 139 mmol/L (ref 135–145)
Total Bilirubin: 1.1 mg/dL (ref 0.3–1.2)
Total Protein: 7.1 g/dL (ref 6.5–8.1)

## 2019-11-10 LAB — C DIFFICILE QUICK SCREEN W PCR REFLEX
C Diff antigen: NEGATIVE
C Diff interpretation: NOT DETECTED
C Diff toxin: NEGATIVE

## 2019-11-10 LAB — URINE CULTURE: Culture: 30000 — AB

## 2019-11-10 LAB — LACTIC ACID, PLASMA: Lactic Acid, Venous: 1.7 mmol/L (ref 0.5–1.9)

## 2019-11-10 MED ORDER — POTASSIUM CHLORIDE CRYS ER 20 MEQ PO TBCR
40.0000 meq | EXTENDED_RELEASE_TABLET | Freq: Once | ORAL | Status: AC
  Administered 2019-11-10: 40 meq via ORAL
  Filled 2019-11-10: qty 2

## 2019-11-10 MED ORDER — SODIUM CHLORIDE 0.9 % IV BOLUS
1000.0000 mL | Freq: Once | INTRAVENOUS | Status: AC
  Administered 2019-11-10: 1000 mL via INTRAVENOUS

## 2019-11-10 MED ORDER — SODIUM CHLORIDE 0.9 % IV SOLN
500.0000 mg | INTRAVENOUS | Status: DC
  Administered 2019-11-10 – 2019-11-11 (×2): 500 mg via INTRAVENOUS
  Filled 2019-11-10 (×2): qty 500

## 2019-11-10 MED ORDER — SODIUM CHLORIDE 0.9 % IV SOLN
INTRAVENOUS | Status: DC | PRN
  Administered 2019-11-10: 250 mL via INTRAVENOUS

## 2019-11-10 NOTE — Progress Notes (Signed)
*  PRELIMINARY RESULTS* Echocardiogram 2D Echocardiogram has been performed.  Jacqueline [name] 11/10/2019, 2:06 PM

## 2019-11-10 NOTE — Progress Notes (Signed)
Patient called to this RN complaining she "feels like her fever is back." This RN to bedside to assess patient, she is diaphoretic and malaise in appearance compared to this mornings assessment. VSS, ibuprofen given and cold compress applied to neck.   13:30- Patient called to this RN stating she "threw up." PRN Ondansetron given.

## 2019-11-10 NOTE — Plan of Care (Signed)
  Problem: Clinical Measurements: Goal: Diagnostic test results will improve Outcome: Progressing   Problem: Respiratory: Goal: Ability to maintain adequate ventilation will improve Outcome: Progressing   Problem: Clinical Measurements: Goal: Signs and symptoms of infection will decrease Outcome: Not Progressing Note: Patient has remained tachycardiac and febrile.

## 2019-11-10 NOTE — Progress Notes (Signed)
PROGRESS NOTE    Jacqueline [name]  ZCH:[id number] DOB: 2001-01-11 DOA: 11/08/2019 PCP: Patient, No Pcp Per    Brief Narrative:  Jacqueline [name] is a 18 y.o. F with no sig PMHx who presents with 1 week fever and abdominal discomfort.  Covid test was negative.  In the ED she was febrile, tachycardic and with leukocytosis.  CT of abdomen showed normal appearing appendix, patchy groundglass density in the left lung base which could represent atypical pneumonia.  Scattered right lower quadrant lymphadenopathies related to mesenteric adenitis.  Acid was normal.Acute hepatitis panel namely hepatitis B surface antigen, hepatitis C viral antibody, hepatitis A IgM and hepatitis B core IgM have been nonreactive.  On admission transaminases were elevated with AST of 251, ALT 215 and alkaline phosphatase of 181 and total bilirubin 1.7.  HIV test has been negative.  Chest x-ray was clear, ultrasound liver was unremarkable.  She was started on empiric Zosyn for possible colitis and IV fluids and the hospitalist service was called for admission and evaluation.    Consultants:   GI  Procedures:  CT abdomen and pelvis on 11/08/2019 Normal-appearing appendix.  Patchy ground-glass density in the left lung base which could represent atypical pneumonia. Correlation with COVID-19 testing is recommended.  Scattered right lower quadrant lymphadenopathy which may be related to mesenteric adenitis.  Antimicrobials:   Zosyn   Subjective: She was febrile and tachycardic.  Was given ibuprofen and Tylenol.  Was also given a dose of labetalol and bolus of IV fluids.   This a.m. patient reports feeling better.  Denies palpitations, shortness of breath, abdominal pain, nausea vomiting or diarrhea.  Last episode of diarrhea was yesterday morning.   Tele Sinus tach  Objective: Vitals:   11/10/19 0601 11/10/19 0631 11/10/19 0701 11/10/19 0725  BP: 137/76 (!) 144/84 131/83   Pulse: (!) 121  (!) 123 (!) 118 (!) 122  Resp:    19  Temp:    100.3 F (37.9 C)  TempSrc:    Oral  SpO2: 97% 97% 97% 97%  Weight:      Height:        Intake/Output Summary (Last 24 hours) at 11/10/2019 1057 Last data filed at 11/10/2019 1010 Gross per 24 hour  Intake 240 ml  Output 2300 ml  Net -2060 ml   Filed Weights   11/08/19 1836 11/10/19 0238  Weight: 52.2 kg 50.1 kg    Examination: General exam: Appears calm and comfortable , nad Respiratory system: Clear to auscultation, no r/w/r cardiovascular system: S1 & S2 heard, RRR. Tachycardic mildly  No JVD, murmurs, rubs, gallops or clicks.  Gastrointestinal system: Abdomen is nondistended, soft and nontender.  Normal bowel sounds heard. No rebound or guarding Central nervous system: Alert and oriented x3. No focal neurological deficits. Extremities: No edema Skin: Warm and dry Psychiatry: Judgement and insight appear normal. Mood & affect appropriate.     Data Reviewed: I have personally reviewed following labs and imaging studies  CBC: Recent Labs  Lab 11/08/19 1840 11/09/19 0128 11/09/19 2121 11/10/19 0006  WBC 15.6* 14.7* 15.2* 15.7*  NEUTROABS  --   --  13.6*  --   HGB 12.0 11.7* 10.1* 9.4*  HCT 37.3 36.5 30.6* 29.5*  MCV 77.7* 77.8* 75.0* 77.8*  PLT 356 340 317 300   Basic Metabolic Panel: Recent Labs  Lab 11/08/19 1840 11/09/19 0128 11/09/19 0619 11/10/19 0006  NA 135 139 140 139  K 2.9* 2.7* 3.5 3.3*  CL 99  104 107 109  CO2 20*  GLUCOSE 151* 109* 105* 142*  BUN 6 <5* <5* <5*  CREATININE 0.58 0.45 0.40* 0.49  CALCIUM 9.4 8.8* 8.7* 8.6*  MG  --  1.9 1.9  --    GFR: Estimated Creatinine Clearance: 77.1 mL/min (by C-G formula based on SCr of 0.49 mg/dL). Liver Function Tests: Recent Labs  Lab 11/08/19 1840 11/09/19 0128 11/10/19 0006  AST 251* 126* 78*  ALT 215* 176* 114*  ALKPHOS 181* 176* 137*  BILITOT 1.5* 1.7* 1.1  PROT 8.8* 8.8* 7.1  ALBUMIN 4.0 4.1 3.1*   Recent Labs  Lab  11/08/19 1840  LIPASE 37   No results for input(s): AMMONIA in the last 168 hours. Coagulation Profile: Recent Labs  Lab 11/09/19 1136  INR 1.1   Cardiac Enzymes: No results for input(s): CKTOTAL, CKMB, CKMBINDEX, TROPONINI in the last 168 hours. BNP (last 3 results) No results for input(s): PROBNP in the last 8760 hours. HbA1C: No results for input(s): HGBA1C in the last 72 hours. CBG: No results for input(s): GLUCAP in the last 168 hours. Lipid Profile: No results for input(s): CHOL, HDL, LDLCALC, TRIG, CHOLHDL, LDLDIRECT in the last 72 hours. Thyroid Function Tests: No results for input(s): TSH, T4TOTAL, FREET4, T3FREE, THYROIDAB in the last 72 hours. Anemia Panel: Recent Labs    11/09/19 1136  VITAMINB12 547  FOLATE 11.9  TIBC 299  IRON 8*   Sepsis Labs: Recent Labs  Lab 11/08/19 2100 11/09/19 0128 11/09/19 2121 11/10/19 0005  LATICACIDVEN 0.9 1.0 1.0 1.7    Recent Results (from the past 240 hour(s))  Culture, blood (routine x 2)     Status: None (Preliminary result)   Collection Time: 11/08/19  9:00 PM   Specimen: BLOOD  Result Value Ref Range Status   Specimen Description BLOOD LEFT ARM  Final   Special Requests   Final    BOTTLES DRAWN AEROBIC AND ANAEROBIC Blood Culture adequate volume   Culture   Final    NO GROWTH 2 DAYS Performed at [facility], [address], [place], [place] [address]    Report Status PENDING  Incomplete  SARS CORONAVIRUS 2 (TAT 6-24 HRS) Nasopharyngeal Nasopharyngeal Swab     Status: None   Collection Time: 11/08/19  9:00 PM   Specimen: Nasopharyngeal Swab  Result Value Ref Range Status   SARS Coronavirus 2 NEGATIVE NEGATIVE Final    Comment: (NOTE) SARS-CoV-2 target nucleic acids are NOT DETECTED. The SARS-CoV-2 RNA is generally detectable in upper and lower respiratory specimens during the acute phase of infection. Negative results do not preclude SARS-CoV-2 infection, do not rule out co-infections with  other pathogens, and should not be used as the sole basis for treatment or other patient management decisions. Negative results must be combined with clinical observations, patient history, and epidemiological information. The expected result is Negative. Fact Sheet for Patients: HairSlick.no Fact Sheet for Healthcare Providers: quierodirigir.com This test is not yet approved or cleared by the [place] FDA and  has been authorized for detection and/or diagnosis of SARS-CoV-2 by FDA under an Emergency Use Authorization (EUA). This EUA will remain  in effect (meaning this test can be used) for the duration of the COVID-19 declaration under Section 56 4(b)(1) of the Act, 21 U.S.C. section 360bbb-3(b)(1), unless the authorization is terminated or revoked sooner. Performed at [facility], [address], [place], [place] [address]   Culture, blood (routine x 2)  Status: None (Preliminary result)   Collection Time: 11/08/19  9:01 PM   Specimen: BLOOD  Result Value Ref Range Status   Specimen Description BLOOD LEFT ASSIST CONTROL  Final   Special Requests   Final    BOTTLES DRAWN AEROBIC AND ANAEROBIC Blood Culture adequate volume   Culture   Final    NO GROWTH 2 DAYS Performed at [facility], [address], [place], [place] [address]    Report Status PENDING  Incomplete         Radiology Studies: Ct Abdomen Pelvis W Contrast  Result Date: 11/08/2019 CLINICAL DATA:  Fevers EXAM: CT ABDOMEN AND PELVIS WITH CONTRAST TECHNIQUE: Multidetector CT imaging of the abdomen and pelvis was performed using the standard protocol following bolus administration of intravenous contrast. CONTRAST:  7mL OMNIPAQUE IOHEXOL 300 MG/ML  SOLN COMPARISON:  None. FINDINGS: Lower chest: Lung bases demonstrate minimal dependent atelectatic changes. Additionally in the left lung base there is some patchy ground-glass density. This  could represent atypical pneumonia. Hepatobiliary: No focal liver abnormality is seen. No gallstones, gallbladder wall thickening, or biliary dilatation. Pancreas: Unremarkable. No pancreatic ductal dilatation or surrounding inflammatory changes. Spleen: Normal in size without focal abnormality. Adrenals/Urinary Tract: Adrenal glands are within normal limits. The kidneys are well visualized bilaterally. No renal calculi or obstructive changes are noted. The bladder is well distended. Stomach/Bowel: Colon is decompressed without inflammatory change. The appendix is air-filled and within normal limits. No inflammatory changes are seen. Small bowel and stomach are within normal limits. Vascular/Lymphatic: No vascular abnormality is seen. Scattered small right lower quadrant lymph nodes are noted which could be related to mesenteric adenitis. None of these measure over 1 cm in short axis. Reproductive: Uterus and bilateral adnexa are unremarkable. Other: No abdominal wall hernia or abnormality. No abdominopelvic ascites. Musculoskeletal: No acute or significant osseous findings. IMPRESSION: Normal-appearing appendix. Patchy ground-glass density in the left lung base which could represent atypical pneumonia. Correlation with COVID-19 testing is recommended. Scattered right lower quadrant lymphadenopathy which may be related to mesenteric adenitis. Electronically Signed   By: Inez Catalina M.D.   On: 11/08/2019 21:21   Dg Chest Port 1 View  Result Date: 11/08/2019 CLINICAL DATA:  FEVER EXAM: PORTABLE CHEST 1 VIEW COMPARISON:  07/18/2008 FINDINGS: The heart size and mediastinal contours are within normal limits. Both lungs are clear. The visualized skeletal structures are unremarkable. IMPRESSION: No active disease. Electronically Signed   By: Constance Holster M.D.   On: 11/08/2019 19:36   US Abdomen Limited Ruq  Result Date: 11/08/2019 CLINICAL DATA:  Right upper quadrant pain with fever for 2 weeks. EXAM:  ULTRASOUND ABDOMEN LIMITED RIGHT UPPER QUADRANT COMPARISON:  None. FINDINGS: Gallbladder: No gallstones or wall thickening visualized. No sonographic Murphy sign noted by sonographer. Common bile duct: Diameter: 2 mm Liver: No focal lesion identified. Within normal limits in parenchymal echogenicity. Portal vein is patent on color Doppler imaging with normal direction of blood flow towards the liver. Other: None. IMPRESSION: Normal right upper quadrant ultrasound. Electronically Signed   By: Lajean Manes M.D.   On: 11/08/2019 20:23        Scheduled Meds: . enoxaparin (LOVENOX) injection  40 mg Subcutaneous QHS   Continuous Infusions: . sodium chloride 250 mL (11/10/19 0709)  . 0.9 % NaCl with KCl 20 mEq / L 150 mL/hr at 11/10/19 0705  . piperacillin-tazobactam 3.375 g (11/10/19 0710)    Assessment & Plan:   Principal Problem:   Fever Active Problems:  Atypical pneumonia   Transaminitis   Hypokalemia   Diarrhea   Fever/sepsis- etiology unclear. Possibly due to atypic pna. Tachycardic and leukocytosis.   Blood cultures urine cultures so far negative final results pending On Zosyn empirically.  Will add azithromycin IV.  Tachycardia-appears to be sinus tachycardia on telemetry. Possibly due to underlying infection/sepsis. Continue supportive care We will check echo  Transaminitis Etiology unclear.  Possibly due to viral/pneumonia.  GI consulted and recommend continue treatment for underlying infection as above. Possibly may be the cause. LFTs trending down Per GI If the trend does not improve then we will check for other causes of acute hepatitis B, C, HIV, EBV CMV and HSV.-Hold off for now      Atypical pneumonia on CT  covid test neg.  Per pt everyone at her job was tested also and was negative Will continue zosyn.  We will add azithromycin IV    Hypokalemia Continue to replace Continue to monitor  Diarrhea Improved, none reported Possibly 2/2 viral  gastroenteritis.?   DVT prophylaxis: lovenox Code Status: Full Family Communication: None at bedside Disposition Plan: Possible DC in a.m. if clinically stable       LOS: 2 days   Time spent: 45 minutes with more than 50% on COC    Lynn ItoSahar Sian Joles, MD [facility] Pager [phone]  If 7PM-7AM, please contact night-coverage [email or web address] Password [email or web address] 11/10/2019, 10:57 AM Patient ID: Brock Raarolina L Urrutia Perdomo, female   DOB: 11/08/2001, 18 y.o.   MRN: [id number]

## 2019-11-10 NOTE — Progress Notes (Signed)
Jonathon Bellows , MD [address] [place], [place], [place], [address] [address] [place], [place], [place], [address] Phone: [phone]  Fax: [phone]   Archie Balboa Madelynn Done Perdomo is being followed for abnormal LFTs day 1 of follow up   Subjective:  Denies any complaints , feels ok    Objective: Vital signs in last 24 hours: Vitals:   11/10/19 0601 11/10/19 0631 11/10/19 0701 11/10/19 0725  BP: 137/76 (!) 144/84 131/83   Pulse: (!) 121 (!) 123 (!) 118 (!) 122  Resp:    19  Temp:    100.3 F (37.9 C)  TempSrc:    Oral  SpO2: 97% 97% 97% 97%  Weight:      Height:       Weight change: -2.087 kg  Intake/Output Summary (Last 24 hours) at 11/10/2019 1248 Last data filed at 11/10/2019 1100 Gross per 24 hour  Intake 240 ml  Output 2600 ml  Net -2360 ml     Exam: Heart:: Regular rate and rhythm, S1S2 present or without murmur or extra heart sounds Lungs: normal, clear to auscultation and clear to auscultation and percussion Abdomen: soft, nontender, normal bowel sounds   Lab Results: @LABTEST2 @ Micro Results: Recent Results (from the past 240 hour(s))  Culture, blood (routine x 2)     Status: None (Preliminary result)   Collection Time: 11/08/19  9:00 PM   Specimen: BLOOD  Result Value Ref Range Status   Specimen Description BLOOD LEFT ARM  Final   Special Requests   Final    BOTTLES DRAWN AEROBIC AND ANAEROBIC Blood Culture adequate volume   Culture   Final    NO GROWTH 2 DAYS Performed at [facility], [address] [place], [place] [address]    Report Status PENDING  Incomplete  SARS CORONAVIRUS 2 (TAT 6-24 HRS) Nasopharyngeal Nasopharyngeal Swab     Status: None   Collection Time: 11/08/19  9:00 PM   Specimen: Nasopharyngeal Swab  Result Value Ref Range Status   SARS Coronavirus 2 NEGATIVE NEGATIVE Final    Comment: (NOTE) SARS-CoV-2 target nucleic acids are NOT DETECTED. The SARS-CoV-2 RNA is generally detectable in upper and  lower respiratory specimens during the acute phase of infection. Negative results do not preclude SARS-CoV-2 infection, do not rule out co-infections with other pathogens, and should not be used as the sole basis for treatment or other patient management decisions. Negative results must be combined with clinical observations, patient history, and epidemiological information. The expected result is Negative. Fact Sheet for Patients: SugarRoll.be Fact Sheet for Healthcare Providers: https://www.woods-mathews.com/ This test is not yet approved or cleared by the [place] FDA and  has been authorized for detection and/or diagnosis of SARS-CoV-2 by FDA under an Emergency Use Authorization (EUA). This EUA will remain  in effect (meaning this test can be used) for the duration of the COVID-19 declaration under Section 56 4(b)(1) of the Act, 21 U.S.C. section 360bbb-3(b)(1), unless the authorization is terminated or revoked sooner. Performed at [facility], [place] [address] [place], [place] [address]   Culture, blood (routine x 2)     Status: None (Preliminary result)   Collection Time: 11/08/19  9:01 PM   Specimen: BLOOD  Result Value Ref Range Status   Specimen Description BLOOD LEFT ASSIST CONTROL  Final   Special Requests   Final    BOTTLES DRAWN AEROBIC AND ANAEROBIC Blood Culture adequate volume   Culture   Final  NO GROWTH 2 DAYS Performed at [facility], [address] [place], [place] [address]    Report Status PENDING  Incomplete  C difficile quick scan w PCR reflex     Status: None   Collection Time: 11/09/19 12:54 PM   Specimen: STOOL  Result Value Ref Range Status   C Diff antigen NEGATIVE NEGATIVE Final   C Diff toxin NEGATIVE NEGATIVE Final   C Diff interpretation No C. difficile detected.  Final    Comment: Performed at [facility], [address] [place], [place] [address]    Studies/Results: Ct Abdomen Pelvis W Contrast  Result Date: 11/08/2019 CLINICAL DATA:  Fevers EXAM: CT ABDOMEN AND PELVIS WITH CONTRAST TECHNIQUE: Multidetector CT imaging of the abdomen and pelvis was performed using the standard protocol following bolus administration of intravenous contrast. CONTRAST:  99mL OMNIPAQUE IOHEXOL 300 MG/ML  SOLN COMPARISON:  None. FINDINGS: Lower chest: Lung bases demonstrate minimal dependent atelectatic changes. Additionally in the left lung base there is some patchy ground-glass density. This could represent atypical pneumonia. Hepatobiliary: No focal liver abnormality is seen. No gallstones, gallbladder wall thickening, or biliary dilatation. Pancreas: Unremarkable. No pancreatic ductal dilatation or surrounding inflammatory changes. Spleen: Normal in size without focal abnormality. Adrenals/Urinary Tract: Adrenal glands are within normal limits. The kidneys are well visualized bilaterally. No renal calculi or obstructive changes are noted. The bladder is well distended. Stomach/Bowel: Colon is decompressed without inflammatory change. The appendix is air-filled and within normal limits. No inflammatory changes are seen. Small bowel and stomach are within normal limits. Vascular/Lymphatic: No vascular abnormality is seen. Scattered small right lower quadrant lymph nodes are noted which could be related to mesenteric adenitis. None of these measure over 1 cm in short axis. Reproductive: Uterus and bilateral adnexa are unremarkable. Other: No abdominal wall hernia or abnormality. No abdominopelvic ascites. Musculoskeletal: No acute or significant osseous findings. IMPRESSION: Normal-appearing appendix. Patchy ground-glass density in the left lung base which could represent atypical pneumonia. Correlation with COVID-19 testing is recommended. Scattered right lower quadrant lymphadenopathy which may be related to mesenteric adenitis. Electronically Signed   By: Alcide Clever  M.D.   On: 11/08/2019 21:21   Dg Chest Port 1 View  Result Date: 11/08/2019 CLINICAL DATA:  FEVER EXAM: PORTABLE CHEST 1 VIEW COMPARISON:  07/18/2008 FINDINGS: The heart size and mediastinal contours are within normal limits. Both lungs are clear. The visualized skeletal structures are unremarkable. IMPRESSION: No active disease. Electronically Signed   By: Katherine Mantle M.D.   On: 11/08/2019 19:36   US Abdomen Limited Ruq  Result Date: 11/08/2019 CLINICAL DATA:  Right upper quadrant pain with fever for 2 weeks. EXAM: ULTRASOUND ABDOMEN LIMITED RIGHT UPPER QUADRANT COMPARISON:  None. FINDINGS: Gallbladder: No gallstones or wall thickening visualized. No sonographic Murphy sign noted by sonographer. Common bile duct: Diameter: 2 mm Liver: No focal lesion identified. Within normal limits in parenchymal echogenicity. Portal vein is patent on color Doppler imaging with normal direction of blood flow towards the liver. Other: None. IMPRESSION: Normal right upper quadrant ultrasound. Electronically Signed   By: Amie Portland M.D.   On: 11/08/2019 20:23   Medications: I have reviewed the patient's current medications. Scheduled Meds:  enoxaparin (LOVENOX) injection  40 mg Subcutaneous QHS   Continuous Infusions:  sodium chloride 250 mL (11/10/19 0709)   0.9 % NaCl with KCl 20 mEq / L 150 mL/hr at 11/10/19 0705   azithromycin 500 mg (11/10/19 1143)   piperacillin-tazobactam 3.375 g (11/10/19  0710)   PRN Meds:.sodium chloride, ibuprofen, ondansetron **OR** ondansetron (ZOFRAN) IV   Assessment: Principal Problem:   Fever Active Problems:   Atypical pneumonia   Transaminitis   Hypokalemia   Diarrhea  Eliah L Shyrl NumbersUrrutia Perdomo is a 18 y.o. y/o female admitted with a short history of fever, abdominal discomfort, leukocytosis all suggestive of an infectious process.  The LFTs were initially raised predominantly the transaminases which have been trending downwards indicating an acute  process most likely either infectious or related to a toxin.  Could be related to her pneumonia.  Symptoms have significantly better than yesterday I do not think this requires any further evaluation.  Iron studies show iron deficiency.  Says she has very heavy periods, suggest iron replacement       LOS: 2 days   Wyline MoodKiran Lacharles Altschuler, MD 11/10/2019, 12:48 PM

## 2019-11-11 DIAGNOSIS — R000 Tachycardia, unspecified: Secondary | ICD-10-CM

## 2019-11-11 LAB — CBC
HCT: 29.1 % — ABNORMAL LOW (ref 36.0–46.0)
Hemoglobin: 9.6 g/dL — ABNORMAL LOW (ref 12.0–15.0)
MCH: 25.1 pg — ABNORMAL LOW (ref 26.0–34.0)
MCHC: 33 g/dL (ref 30.0–36.0)
MCV: 76 fL — ABNORMAL LOW (ref 80.0–100.0)
Platelets: 322 10*3/uL (ref 150–400)
RBC: 3.83 MIL/uL — ABNORMAL LOW (ref 3.87–5.11)
RDW: 14.2 % (ref 11.5–15.5)
WBC: 15.9 10*3/uL — ABNORMAL HIGH (ref 4.0–10.5)
nRBC: 0 % (ref 0.0–0.2)

## 2019-11-11 LAB — COMPREHENSIVE METABOLIC PANEL
ALT: 128 U/L — ABNORMAL HIGH (ref 0–44)
AST: 117 U/L — ABNORMAL HIGH (ref 15–41)
Albumin: 3.1 g/dL — ABNORMAL LOW (ref 3.5–5.0)
Alkaline Phosphatase: 182 U/L — ABNORMAL HIGH (ref 38–126)
Anion gap: 11 (ref 5–15)
BUN: <5 mg/dL — ABNORMAL LOW (ref 6–20)
CO2: 18 mmol/L — ABNORMAL LOW (ref 22–32)
Calcium: 8.6 mg/dL — ABNORMAL LOW (ref 8.9–10.3)
Chloride: 110 mmol/L (ref 98–111)
Creatinine, Ser: 0.38 mg/dL — ABNORMAL LOW (ref 0.44–1.00)
GFR calc Af Amer: >60 mL/min (ref >60)
GFR calc non Af Amer: >60 mL/min (ref >60)
Glucose, Bld: 110 mg/dL — ABNORMAL HIGH (ref 70–99)
Potassium: 3.7 mmol/L (ref 3.5–5.1)
Sodium: 139 mmol/L (ref 135–145)
Total Bilirubin: 1.1 mg/dL (ref 0.3–1.2)
Total Protein: 7.5 g/dL (ref 6.5–8.1)

## 2019-11-11 NOTE — Progress Notes (Signed)
PROGRESS NOTE    Jacqueline [name]  ZOX:[id number] DOB: [birth year] DOA: 11/08/2019 PCP: Patient, No Pcp Per    Brief Narrative:  Jacqueline [name] is a 18 y.o. F with no sig PMHx who presents with 1 week fever and abdominal discomfort.  Covid test was negative.  In the ED she was febrile, tachycardic and with leukocytosis.  CT of abdomen showed normal appearing appendix, patchy groundglass density in the left lung base which could represent atypical pneumonia.  Scattered right lower quadrant lymphadenopathies related to mesenteric adenitis.  Acid was normal.Acute hepatitis panel namely hepatitis B surface antigen, hepatitis C viral antibody, hepatitis A IgM and hepatitis B core IgM have been nonreactive.  On admission transaminases were elevated with AST of 251, ALT 215 and alkaline phosphatase of 181 and total bilirubin 1.7.  HIV test has been negative.  Chest x-ray was clear, ultrasound liver was unremarkable.  She was started on empiric Zosyn for possible colitis and IV fluids and the hospitalist service was called for admission and evaluation.    Consultants:   GI  Procedures:  CT abdomen and pelvis on 11/08/2019 Normal-appearing appendix. Patchy ground-glass density in the left lung base which could represent atypical pneumonia. Correlation with COVID-19 testing is recommended. Scattered right lower quadrant lymphadenopathy which may be related to mesenteric adenitis.  Echo 11/28 1. Left ventricular ejection fraction, by visual estimation, is 60 to 65%. The left ventricle has normal function. There is no left ventricular hypertrophy.  2. Global right ventricle has normal systolic function.The right ventricular size is normal. No increase in right ventricular wall thickness.  3. TR signal is inadequate for assessing pulmonary artery systolic pressure.  Antimicrobials:   Zosyn   Subjective: She was febrile and tachycardic intermittently again . Tmax 101  Tele , sinus tach Pt denies nausea, vomiting, shortness of breath, dizziness, abdominal pain   Objective: Vitals:   11/11/19 0041 11/11/19 0523 11/11/19 0800 11/11/19 0925  BP:  (!) 128/91 132/84 130/76  Pulse: (!) 114 (!) 127 (!) 143 (!) 135  Resp:  20 19 20   Temp:  98.4 F (36.9 C) (!) 100.4 F (38 C) 99.8 F (37.7 C)  TempSrc:  Oral Oral Oral  SpO2:  97% 96% 96%  Weight:  50.3 kg    Height:        Intake/Output Summary (Last 24 hours) at 11/11/2019 1344 Last data filed at 11/11/2019 1300 Gross per 24 hour  Intake 7886.39 ml  Output 4200 ml  Net 3686.39 ml   Filed Weights   11/08/19 1836 11/10/19 0238 11/11/19 0523  Weight: 52.2 kg 50.1 kg 50.3 kg    Examination: General exam: Appears tired, NAD Respiratory system: Clear to auscultation, no r/w/r cardiovascular system: S1 & S2 heard, RRR. Tachycardic  No JVD, murmurs, rubs, gallops or clicks.  Gastrointestinal system: Abdomen is nondistended, soft and nontender.  +bs No rebound or guarding Central nervous system: Alert and oriented x3. No focal neurological deficits. Extremities: No edema or cyanosis Skin: Warm and dry Psychiatry: Judgement and insight appear normal. Mood & affect appropriate.     Data Reviewed: I have personally reviewed following labs and imaging studies  CBC: Recent Labs  Lab 11/08/19 1840 11/09/19 0128 11/09/19 2121 11/10/19 0006 11/11/19 0526  WBC 15.6* 14.7* 15.2* 15.7* 15.9*  NEUTROABS  --   --  13.6*  --   --   HGB 12.0 11.7* 10.1* 9.4* 9.6*  HCT 37.3 36.5 30.6* 29.5* 29.1*  MCV 77.7*  77.8* 75.0* 77.8* 76.0*  PLT 356 340 317 300 322   Basic Metabolic Panel: Recent Labs  Lab 11/08/19 1840 11/09/19 0128 11/09/19 0619 11/10/19 0006 11/11/19 0526  NA 135 139 140 139 139  K 2.9* 2.7* 3.5 3.3* 3.7  CL 99 104 107 109 110  CO2 24 22 23  20* 18*  GLUCOSE 151* 109* 105* 142* 110*  BUN 6 <5* <5* <5* <5*  CREATININE 0.58 0.45 0.40* 0.49 0.38*  CALCIUM 9.4 8.8* 8.7* 8.6* 8.6*   MG  --  1.9 1.9  --   --    GFR: Estimated Creatinine Clearance: 77.1 mL/min (A) (by C-G formula based on SCr of 0.38 mg/dL (L)). Liver Function Tests: Recent Labs  Lab 11/08/19 1840 11/09/19 0128 11/10/19 0006 11/11/19 0526  AST 251* 126* 78* 117*  ALT 215* 176* 114* 128*  ALKPHOS 181* 176* 137* 182*  BILITOT 1.5* 1.7* 1.1 1.1  PROT 8.8* 8.8* 7.1 7.5  ALBUMIN 4.0 4.1 3.1* 3.1*   Recent Labs  Lab 11/08/19 1840  LIPASE 37   No results for input(s): AMMONIA in the last 168 hours. Coagulation Profile: Recent Labs  Lab 11/09/19 1136  INR 1.1   Cardiac Enzymes: No results for input(s): CKTOTAL, CKMB, CKMBINDEX, TROPONINI in the last 168 hours. BNP (last 3 results) No results for input(s): PROBNP in the last 8760 hours. HbA1C: No results for input(s): HGBA1C in the last 72 hours. CBG: No results for input(s): GLUCAP in the last 168 hours. Lipid Profile: No results for input(s): CHOL, HDL, LDLCALC, TRIG, CHOLHDL, LDLDIRECT in the last 72 hours. Thyroid Function Tests: No results for input(s): TSH, T4TOTAL, FREET4, T3FREE, THYROIDAB in the last 72 hours. Anemia Panel: Recent Labs    11/09/19 1136  VITAMINB12 547  FOLATE 11.9  TIBC 299  IRON 8*   Sepsis Labs: Recent Labs  Lab 11/08/19 2100 11/09/19 0128 11/09/19 2121 11/10/19 0005  LATICACIDVEN 0.9 1.0 1.0 1.7    Recent Results (from the past 240 hour(s))  Urine Culture     Status: Abnormal   Collection Time: 11/08/19  7:24 PM   Specimen: Urine, Random  Result Value Ref Range Status   Specimen Description   Final    URINE, RANDOM Performed at [facility], [address] [place], [place] [address]    Special Requests   Final    NONE Performed at [facility], [address] [place], [place] [address]    Culture (A)  Final    30,000 COLONIES/mL MULTIPLE SPECIES PRESENT, SUGGEST RECOLLECTION   Report Status 11/10/2019 FINAL  Final  Culture, blood (routine x 2)     Status: None  (Preliminary result)   Collection Time: 11/08/19  9:00 PM   Specimen: BLOOD  Result Value Ref Range Status   Specimen Description BLOOD LEFT ARM  Final   Special Requests   Final    BOTTLES DRAWN AEROBIC AND ANAEROBIC Blood Culture adequate volume   Culture   Final    NO GROWTH 3 DAYS Performed at [facility], [address] [place], [place] [address]    Report Status PENDING  Incomplete  SARS CORONAVIRUS 2 (TAT 6-24 HRS) Nasopharyngeal Nasopharyngeal Swab     Status: None   Collection Time: 11/08/19  9:00 PM   Specimen: Nasopharyngeal Swab  Result Value Ref Range Status   SARS Coronavirus 2 NEGATIVE NEGATIVE Final    Comment: (NOTE) SARS-CoV-2 target nucleic acids are NOT DETECTED. The SARS-CoV-2 RNA is generally detectable  in upper and lower respiratory specimens during the acute phase of infection. Negative results do not preclude SARS-CoV-2 infection, do not rule out co-infections with other pathogens, and should not be used as the sole basis for treatment or other patient management decisions. Negative results must be combined with clinical observations, patient history, and epidemiological information. The expected result is Negative. Fact Sheet for Patients: HairSlick.no Fact Sheet for Healthcare Providers: quierodirigir.com This test is not yet approved or cleared by the [place] FDA and  has been authorized for detection and/or diagnosis of SARS-CoV-2 by FDA under an Emergency Use Authorization (EUA). This EUA will remain  in effect (meaning this test can be used) for the duration of the COVID-19 declaration under Section 56 4(b)(1) of the Act, 21 U.S.C. section 360bbb-3(b)(1), unless the authorization is terminated or revoked sooner. Performed at [facility], [address], [place], [place] [address]   Culture, blood (routine x 2)     Status: None (Preliminary result)   Collection Time:  11/08/19  9:01 PM   Specimen: BLOOD  Result Value Ref Range Status   Specimen Description BLOOD LEFT ASSIST CONTROL  Final   Special Requests   Final    BOTTLES DRAWN AEROBIC AND ANAEROBIC Blood Culture adequate volume   Culture   Final    NO GROWTH 3 DAYS Performed at [facility], [address] [place], [place] [address]    Report Status PENDING  Incomplete  C difficile quick scan w PCR reflex     Status: None   Collection Time: 11/09/19 12:54 PM   Specimen: STOOL  Result Value Ref Range Status   C Diff antigen NEGATIVE NEGATIVE Final   C Diff toxin NEGATIVE NEGATIVE Final   C Diff interpretation No C. difficile detected.  Final    Comment: Performed at [facility], [address] [place], [place] [address]         Radiology Studies: No results found.      Scheduled Meds: . enoxaparin (LOVENOX) injection  40 mg Subcutaneous QHS   Continuous Infusions: . sodium chloride 250 mL (11/10/19 0709)  . 0.9 % NaCl with KCl 20 mEq / L 100 mL/hr at 11/11/19 1219  . azithromycin 500 mg (11/11/19 1221)    Assessment & Plan:   Principal Problem:   Fever Active Problems:   Atypical pneumonia   Transaminitis   Hypokalemia   Diarrhea   Fever/sepsis- etiology unclear. Possibly due to atypic pna. Tachycardic and leukocytosis.   EKG sinus tach Blood cultures urine cultures so far negative final results pending On Zosyn and azithro for presumed pna...> spoke to ID by phone and recommended checking CMV, EBV , HIV pcr rna. D/c abx as this is likely viral etiology  Tachycardia-appears to be sinus tachycardia on telemetry. Possibly due to underlying infection/sepsis. Continue supportive care Echo with nml ef.  Transaminitis Etiology unclear.  Possibly due to viral infection GI following LFTs was trending down but now is mildly up again. Again we will check CMV, EBV, HIV as above.  GI also added hepatitis B and C viral loads to be followed.  This  could be seen to be raised in her early infection 1 day IgM antibody is yet to become positive.     Atypical pneumonia on CT  covid test neg.  Per pt everyone at her job was tested also and was negative abx d/c'd as likely viral etiology and pt remains febrile. See above  Hypokalemia Resolved with replacement Continue to monitor  Diarrhea Resolved  Possibly 2/2 viral gastroenteritis. C.diff neg   DVT prophylaxis: lovenox Code Status: Full Family Communication: None at bedside Disposition Plan: Likely be here 1-2 more days until clinically more stabl.e I discussed the above findings and test results and all the tests that are being ordered with the patient all questions concerns were addressed.       LOS: 3 days   Time spent: 45 minutes with more than 50% on COC    Lynn Ito, MD [facility] Pager [phone]  If 7PM-7AM, please contact night-coverage [email or web address] Password [email or web address] 11/11/2019, 1:44 PM Patient ID: Jacqueline [name], female   DOB: [birth year], 18 y.o.   MRN: [id number] Patient ID: Jacqueline [name], female   DOB: 07-Nov-2001, 18 y.o.   MRN: [id number]

## 2019-11-11 NOTE — Progress Notes (Signed)
   Jonathon Bellows , MD [address] [place], [place], [place], [address] [address], [place], [place], [place], [address] Phone: [phone]  Fax: [phone]   Went into room x 2 to find her, not there.     Jacqueline [name] a 18 y.o.y/o femaleadmitted with a short history of fever, abdominal discomfort, leukocytosis all suggestive of an infectious process. Normal right upper quadrant ultrasound.  CT scan of the abdomen suggestive for pneumonia scattered right lower quadrant lymphadenopathy which may related to mesenteric adenitis.  Liver function tests were trending down till yesterday but for unknown reason has gone up subsequently slightly but with a normal bilirubin.   Plan 1.  Continue to monitor LFTs 2. F/u HSV CMV EBV HIV has been ordered, I will also add hepatitis B and C viral loads to be followed.  This could be seen to be raised in an early infection when the IgM antibody is yet to become positive.May still be related to the pneumonia    LOS: 3 days   Jonathon Bellows, MD 11/11/2019, 8:46 AM

## 2019-11-12 DIAGNOSIS — R918 Other nonspecific abnormal finding of lung field: Secondary | ICD-10-CM

## 2019-11-12 DIAGNOSIS — Z91018 Allergy to other foods: Secondary | ICD-10-CM

## 2019-11-12 DIAGNOSIS — R509 Fever, unspecified: Secondary | ICD-10-CM

## 2019-11-12 DIAGNOSIS — R590 Localized enlarged lymph nodes: Secondary | ICD-10-CM

## 2019-11-12 LAB — C-REACTIVE PROTEIN: CRP: 30.8 mg/dL — ABNORMAL HIGH (ref <1.0)

## 2019-11-12 LAB — HEPATIC FUNCTION PANEL
ALT: 122 U/L — ABNORMAL HIGH (ref 0–44)
AST: 71 U/L — ABNORMAL HIGH (ref 15–41)
Albumin: 3 g/dL — ABNORMAL LOW (ref 3.5–5.0)
Alkaline Phosphatase: 203 U/L — ABNORMAL HIGH (ref 38–126)
Bilirubin, Direct: 0.5 mg/dL — ABNORMAL HIGH (ref 0.0–0.2)
Indirect Bilirubin: 0.9 mg/dL (ref 0.3–0.9)
Total Bilirubin: 1.4 mg/dL — ABNORMAL HIGH (ref 0.3–1.2)
Total Protein: 7.4 g/dL (ref 6.5–8.1)

## 2019-11-12 LAB — CBC WITH DIFFERENTIAL/PLATELET
Abs Immature Granulocytes: 0.15 10*3/uL — ABNORMAL HIGH (ref 0.00–0.07)
Basophils Absolute: 0 10*3/uL (ref 0.0–0.1)
Basophils Relative: 0 %
Eosinophils Absolute: 0.2 10*3/uL (ref 0.0–0.5)
Eosinophils Relative: 1 %
HCT: 28 % — ABNORMAL LOW (ref 36.0–46.0)
Hemoglobin: 9 g/dL — ABNORMAL LOW (ref 12.0–15.0)
Immature Granulocytes: 1 %
Lymphocytes Relative: 10 %
Lymphs Abs: 1.6 10*3/uL (ref 0.7–4.0)
MCH: 24.8 pg — ABNORMAL LOW (ref 26.0–34.0)
MCHC: 32.1 g/dL (ref 30.0–36.0)
MCV: 77.1 fL — ABNORMAL LOW (ref 80.0–100.0)
Monocytes Absolute: 0.3 10*3/uL (ref 0.1–1.0)
Monocytes Relative: 2 %
Neutro Abs: 13.7 10*3/uL — ABNORMAL HIGH (ref 1.7–7.7)
Neutrophils Relative %: 86 %
Platelets: 403 10*3/uL — ABNORMAL HIGH (ref 150–400)
RBC: 3.63 MIL/uL — ABNORMAL LOW (ref 3.87–5.11)
RDW: 14 % (ref 11.5–15.5)
WBC: 15.9 10*3/uL — ABNORMAL HIGH (ref 4.0–10.5)
nRBC: 0 % (ref 0.0–0.2)

## 2019-11-12 LAB — LACTATE DEHYDROGENASE: LDH: 279 U/L — ABNORMAL HIGH (ref 98–192)

## 2019-11-12 LAB — INFLUENZA PANEL BY PCR (TYPE A & B)
Influenza A By PCR: NEGATIVE
Influenza B By PCR: NEGATIVE

## 2019-11-12 LAB — FERRITIN: Ferritin: 181 ng/mL (ref 11–307)

## 2019-11-12 LAB — PROCALCITONIN: Procalcitonin: 0.15 ng/mL

## 2019-11-12 MED ORDER — METOPROLOL SUCCINATE ER 25 MG PO TB24
12.5000 mg | ORAL_TABLET | Freq: Every day | ORAL | Status: DC
  Administered 2019-11-12 – 2019-11-13 (×2): 12.5 mg via ORAL
  Filled 2019-11-12 (×2): qty 1

## 2019-11-12 MED ORDER — SODIUM CHLORIDE 0.9 % IV BOLUS
1000.0000 mL | Freq: Once | INTRAVENOUS | Status: AC
  Administered 2019-11-12: 1000 mL via INTRAVENOUS

## 2019-11-12 MED ORDER — SODIUM CHLORIDE 0.9 % IV SOLN
1.0000 g | INTRAVENOUS | Status: DC
  Filled 2019-11-12: qty 10

## 2019-11-12 MED ORDER — AZITHROMYCIN 250 MG PO TABS: 500.0000 mg | ORAL_TABLET | ORAL | Status: DC

## 2019-11-12 NOTE — Progress Notes (Signed)
   11/12/19 1500  Clinical Encounter Type  Visited With Health care provider  Visit Type Initial;Spiritual support  Referral From Nurse  Consult/Referral To Chaplain  Spiritual Encounters  Spiritual Needs Other (Comment)  Chaplain received page for RR and arrived at room. Care team was working with patient. Chaplain made presence known and silently prayed and care team told Chaplain everything was ok she could leave.

## 2019-11-12 NOTE — Consult Note (Signed)
NAME: Jacqueline [name]  DOB: 10-26-2001  MRN: [id number]  Date/Time: 11/12/2019 10:37 AM  REQUESTING PROVIDER: Dr.Amery Subjective:  REASON FOR CONSULT: Fever ? [place] Jacqueline [name] is a 18 y.o. female with no medical history presents on 11/08/2019 with 2-week history of not feeling well.  Patient is a vague historian.:  Before  10/21/2019 she was working at [employer] One of her coworkers tested for  [employer] and everybody was asked to quarantine  at home. She tested negative.She rememberson her bday she got high smoking marijuana and was doing okay until the next week when she had abdominal bloating, constipation and poor appetite. She remembers one day she could not "POOP or PEE". Her mother gave her garlic which regularized her bowels and she had some diarrhea. Her mom noted low grade fever of 100 every day for a week. On 11/26 thanks giving pt was very weak, hot, sweaty and her mom brought her to ED. Pt did not have any cough, sore throat, runny nose, sob, chest pain, dysuria. Pt also had menses when she first got sick and used tampons. She has not had sex in 1 month- has 1 partner for the past year.  In the ED her temp was 102.8, HR 143, labs revealed K of 2.9, cr 0.58, Na 135, alkaline phosphatase 181, AST 251, ALT 215, TB 1.5, lactate 0.9, Hb 12, WBC 15.6, PLT 356. SARS cov2 test was negative  CXR no active disease - CT abdomen revealed Patchy ground-glass density in the left lung base which could represent atypical pneumonia. Correlation with COVID-19 testing is recommended.Scattered right lower quadrant lymphadenopathy which may be related to mesenteric adenitis. She was started on zosyn and zithromycin which were discontinued after hospitalist discussed with on call ID yesterday.    Risk factor-: None Recent Procedure Surgery Injections Trauma Sick contacts Travel Antibiotic use Food- raw/exotic Steroid/immune suppressants/splenectomy/Hardware Animal bites  Tick exposure Water sports Fishing/hunting/animal bird exposure New sex partner - None- sexually active with same guy for the past year Past Medical History:  Diagnosis Date  . Asthma     History reviewed. No pertinent surgical history.  Social History   Socioeconomic History  . Marital status: Single    Spouse name: Not on file  . Number of children: Not on file  . Years of education: Not on file  . Highest education level: Not on file  Occupational History  . Not on file  Social Needs  . Financial resource strain: Not on file  . Food insecurity    Worry: Not on file    Inability: Not on file  . Transportation needs    Medical: Not on file    Non-medical: Not on file  Tobacco Use  . Smoking status: Never Smoker  . Smokeless tobacco: Never Used  Substance and Sexual Activity  . Alcohol use: Not Currently  . Drug use: Not Currently  . Sexual activity: Not on file  Lifestyle  . Physical activity    Days per week: Not on file    Minutes per session: Not on file  . Stress: Not on file  Relationships  . Social Musician on phone: Not on file    Gets together: Not on file    Attends religious service: Not on file    Active member of club or organization: Not on file    Attends meetings of clubs or organizations: Not on file    Relationship status: Not on file  .  Intimate partner violence    Fear of current or ex partner: Not on file    Emotionally abused: Not on file    Physically abused: Not on file    Forced sexual activity: Not on file  Other Topics Concern  . Not on file  Social History Narrative  . Not on file    Family History  Problem Relation Age of Onset  . Diabetes Mother   . Pancreatitis Mother    Allergies  Allergen Reactions  . Avocado Other (See Comments)    Tongue swelling    ID  Recent  Procedure Surgery Injections Trauma Sick contacts Travel Antibiotic use Food- raw/exotic Steroid/immune  suppressants/splenectomy/Hardware Animal bites Tick exposure Water sports Fishing/hunting/animal bird exposure ? Current Facility-Administered Medications  Medication Dose Route Frequency Provider Last Rate Last Dose  . 0.9 %  sodium chloride infusion   Intravenous PRN Lynn ItoAmery, Sahar, MD 10 mL/hr at 11/10/19 0709 250 mL at 11/10/19 0709  . 0.9 % NaCl with KCl 20 mEq/ L  infusion   Intravenous Continuous Lynn ItoAmery, Sahar, MD 100 mL/hr at 11/12/19 0300    . enoxaparin (LOVENOX) injection 40 mg  40 mg Subcutaneous QHS Alberteen Samanford, Christopher P, MD   40 mg at 11/09/19 2201  . ibuprofen (ADVIL) tablet 400 mg  400 mg Oral Q6H PRN Alberteen Samanford, Christopher P, MD   400 mg at 11/12/19 [id number]  . ondansetron (ZOFRAN) tablet 4 mg  4 mg Oral Q6H PRN Danford, Earl Liteshristopher P, MD       Or  . ondansetron (ZOFRAN) injection 4 mg  4 mg Intravenous Q6H PRN Alberteen Samanford, Christopher P, MD   4 mg at 11/10/19 1330     Abtx:  Anti-infectives (From admission, onward)   Start     Dose/Rate Route Frequency Ordered Stop   11/10/19 1115  azithromycin (ZITHROMAX) 500 mg in sodium chloride 0.9 % 250 mL IVPB  Status:  Discontinued     500 mg 250 mL/hr over 60 Minutes Intravenous Every 24 hours 11/10/19 1103 11/11/19 1353   11/09/19 2200  piperacillin-tazobactam (ZOSYN) IVPB 3.375 g  Status:  Discontinued     3.375 g 12.5 mL/hr over 240 Minutes Intravenous Every 8 hours 11/09/19 2122 11/11/19 0825   11/08/19 2045  piperacillin-tazobactam (ZOSYN) IVPB 3.375 g     3.375 g 100 mL/hr over 30 Minutes Intravenous  Once 11/08/19 2031 11/08/19 2224      REVIEW OF SYSTEMS:  Const:  fever,  , negative weight loss Eyes: negative diplopia or visual changes, negative eye pain ENT: negative coryza, negative sore throat Resp: negative cough, hemoptysis, dyspnea Cards: negative for chest pain, palpitations, lower extremity edema GU: negative for frequency, dysuria and hematuria GI: as above Skin: negative for rash and pruritus Heme: negative for  easy bruising and gum/nose bleeding MS: negative for myalgias, arthralgias, back pain and muscle weakness Neurolo:negative for headaches, dizziness, vertigo, memory problems  Psych: negative for feelings of anxiety, depression  Endocrine: negative for thyroid, diabetes Allergy/Immunology- negative for any medication or food allergies ?  Objective:  VITALS:  BP 127/75   Pulse (!) 133   Temp 98.5 F (36.9 C) (Oral)   Resp 20   Ht 4\' 11"  (1.499 m)   Wt 50.3 kg   LMP 10/29/2019   SpO2 97%   BMI 22.42 kg/m  PHYSICAL EXAM:  General: Alert, cooperative, no distress, does not look septic, says she does not feel any fever Head: Normocephalic, without obvious abnormality, atraumatic. Eyes: Conjunctivae clear, anicteric sclerae.  Pupils are equal ENT Nares normal. No drainage or sinus tenderness. Lips, mucosa, and tongue normal. No Thrush Neck: Supple, symmetrical, no adenopathy, thyroid: non tender no carotid bruit and no JVD. Back: No CVA tenderness. Lungs: Clear to auscultation bilaterally. No Wheezing or Rhonchi. No rales. Heart: Regular rate and rhythm, no murmur, rub or gallop. Abdomen: Soft, non-tender,not distended. Bowel sounds normal. No masses Extremities: atraumatic, no cyanosis. No edema. No clubbing Skin: No rashes or lesions. Or bruising Lymph: Cervical, supraclavicular normal. Neurologic: Grossly non-focal Pertinent Labs Lab Results CBC    Component Value Date/Time   WBC 15.9 (H) 11/11/2019 0526   RBC 3.83 (L) 11/11/2019 0526   HGB 9.6 (L) 11/11/2019 0526   HCT 29.1 (L) 11/11/2019 0526   PLT 322 11/11/2019 0526   MCV 76.0 (L) 11/11/2019 0526   MCH 25.1 (L) 11/11/2019 0526   MCHC 33.0 11/11/2019 0526   RDW 14.2 11/11/2019 0526   LYMPHSABS 1.2 11/09/2019 2121   MONOABS 0.2 11/09/2019 2121   EOSABS 0.0 11/09/2019 2121   BASOSABS 0.0 11/09/2019 2121    CMP Latest Ref Rng & Units 11/12/2019 11/11/2019 11/10/2019  Glucose 70 - 99 mg/dL - 110(H) 142(H)  BUN 6 -  20 mg/dL - <5(L) <5(L)  Creatinine 0.44 - 1.00 mg/dL - 0.38(L) 0.49  Sodium 135 - 145 mmol/L - 139 139  Potassium 3.5 - 5.1 mmol/L - 3.7 3.3(L)  Chloride 98 - 111 mmol/L - 110 109  CO2 22 - 32 mmol/L - 18(L) 20(L)  Calcium 8.9 - 10.3 mg/dL - 8.6(L) 8.6(L)  Total Protein 6.5 - 8.1 g/dL 7.4 7.5 7.1  Total Bilirubin 0.3 - 1.2 mg/dL 1.4(H) 1.1 1.1  Alkaline Phos 38 - 126 U/L 203(H) 182(H) 137(H)  AST 15 - 41 U/L 71(H) 117(H) 78(H)  ALT 0 - 44 U/L 122(H) 128(H) 114(H)      Microbiology: Recent Results (from the past 240 hour(s))  Urine Culture     Status: Abnormal   Collection Time: 11/08/19  7:24 PM   Specimen: Urine, Random  Result Value Ref Range Status   Specimen Description   Final    URINE, RANDOM Performed at [facility], [address] [place], [place] [address]    Special Requests   Final    NONE Performed at [facility], [address], [place], [place] [address]    Culture (A)  Final    30,000 COLONIES/mL MULTIPLE SPECIES PRESENT, SUGGEST RECOLLECTION   Report Status 11/10/2019 FINAL  Final  Culture, blood (routine x 2)     Status: None (Preliminary result)   Collection Time: 11/08/19  9:00 PM   Specimen: BLOOD  Result Value Ref Range Status   Specimen Description BLOOD LEFT ARM  Final   Special Requests   Final    BOTTLES DRAWN AEROBIC AND ANAEROBIC Blood Culture adequate volume   Culture   Final    NO GROWTH 4 DAYS Performed at [facility], [address], [place], [place] [address]    Report Status PENDING  Incomplete  SARS CORONAVIRUS 2 (TAT 6-24 HRS) Nasopharyngeal Nasopharyngeal Swab     Status: None   Collection Time: 11/08/19  9:00 PM   Specimen: Nasopharyngeal Swab  Result Value Ref Range Status   SARS Coronavirus 2 NEGATIVE NEGATIVE Final    Comment: (NOTE) SARS-CoV-2 target nucleic acids are NOT DETECTED. The SARS-CoV-2 RNA is generally detectable in upper and lower respiratory specimens during the acute phase of  infection. Negative results do  not preclude SARS-CoV-2 infection, do not rule out co-infections with other pathogens, and should not be used as the sole basis for treatment or other patient management decisions. Negative results must be combined with clinical observations, patient history, and epidemiological information. The expected result is Negative. Fact Sheet for Patients: HairSlick.no Fact Sheet for Healthcare Providers: quierodirigir.com This test is not yet approved or cleared by the [place] FDA and  has been authorized for detection and/or diagnosis of SARS-CoV-2 by FDA under an Emergency Use Authorization (EUA). This EUA will remain  in effect (meaning this test can be used) for the duration of the COVID-19 declaration under Section 56 4(b)(1) of the Act, 21 U.S.C. section 360bbb-3(b)(1), unless the authorization is terminated or revoked sooner. Performed at [facility], [address], [place], [place] [address]   Culture, blood (routine x 2)     Status: None (Preliminary result)   Collection Time: 11/08/19  9:01 PM   Specimen: BLOOD  Result Value Ref Range Status   Specimen Description BLOOD LEFT ASSIST CONTROL  Final   Special Requests   Final    BOTTLES DRAWN AEROBIC AND ANAEROBIC Blood Culture adequate volume   Culture   Final    NO GROWTH 4 DAYS Performed at [facility], [address], [place], [place] [address]    Report Status PENDING  Incomplete  C difficile quick scan w PCR reflex     Status: None   Collection Time: 11/09/19 12:54 PM   Specimen: STOOL  Result Value Ref Range Status   C Diff antigen NEGATIVE NEGATIVE Final   C Diff toxin NEGATIVE NEGATIVE Final   C Diff interpretation No C. difficile detected.  Final    Comment: Performed at [facility], [address] [place], [place] [address]    IMAGING RESULTS:   Scattered right lower quadrant  lymphadenopathy which may be related to mesenteric adenitis. I have personally reviewed the films  2d echo- normal ? Impression/Recommendation ? ?Fever with abnormal LFTS,: she is clinically stable  with clinically no localizing signs: clinically no evidence of pneumonia, skin infection, UTI, . Had some constipation/diarrhea/bloating and Ct abdomen showed rt lower inguinal adenitis ? Mesenteric adenitis  No response to zosyn and azithromycin which were stopped yesterday. ?Viral  ? Adeno ? CMV? EBV  Could she have yersinia especially with GI symptoms and Ct abdomen showing adenopathy? She was on zosyn and the fever did not resolve Will get peripheral smear to look for atypical lymphocytes, LDH, ferritin, Flu ( neg)  CT chest showed left lower lobe GGO- patient has no respiratory symptoms. Also got 2 doses of zithromax with no response t the fever- procal  Low at 0.15  Watch off antibiotics. Await test results ? ___________________________________________________ Discussed with patient and  requesting provider Note:  This document was prepared using Dragon voice recognition software and may include unintentional dictation errors.

## 2019-11-12 NOTE — Significant Event (Signed)
Rapid Response Event Note  Overview: Time Called: 1015 Arrival Time: 1015 Event Type: MEWS  Initial Focused Assessment: Rapid response called for patient with Red MEWS value. Not an acute change. Patient alert and oriented, no complaints of pain and shortness of breath. Current vital signs: BP 127/75, MAP 90, HR 127 ST, oxygen saturations 97% on room air, temperature currently 98.5. These vital signs lowered her MEWS to yellow. Lungs clear except for left lower lobe which was diminished.   Interventions: none.  Plan of Care (if not transferred): Patient's RN has already been in communication with Doctor and updated plan of care to assess for atypical viral illness (negative for COVID). Patient's RN had already treated fever with ibuprofen. Patient to remain on 2A and patient's RN will continue current plan of care. Patient and RN to re call rapid response if needed.   Event Summary:   at      at    Outcome: Stayed in room and stabalized  Event End Time: [address], [place]

## 2019-11-12 NOTE — Progress Notes (Signed)
Jacqueline Repressohini R Whitney Hillegass, MD [address]  [address]  [place], [place] [address]  Main: [phone]  Fax: [phone] Pager: [phone]   Subjective: Patient was febrile to 100.7 today, has tachycardia.  She reports that her appetite is coming back.  She is tolerating regular diet well.  She denies abdominal pain, nausea or vomiting.   Objective: Vital signs in last 24 hours: Vitals:   11/12/19 1016 11/12/19 1031 11/12/19 1040 11/12/19 1601  BP: 127/75 127/70  136/86  Pulse: (!) 133 (!) 120 (!) 108 (!) 109  Resp:    20  Temp: 98.5 F (36.9 C)   98.6 F (37 C)  TempSrc: Oral   Oral  SpO2: 97% 96%  97%  Weight:      Height:       Weight change:   Intake/Output Summary (Last 24 hours) at 11/12/2019 1722 Last data filed at 11/12/2019 1602 Gross per 24 hour  Intake -  Output 3150 ml  Net -3150 ml     Exam: Heart:: Tachycardia, regular rhythm Lungs: bibasilar rales Abdomen: soft, nontender, normal bowel sounds   Lab Results: CBC Latest Ref Rng & Units 11/12/2019 11/11/2019 11/10/2019  WBC 4.0 - 10.5 K/uL 15.9(H) 15.9(H) 15.7(H)  Hemoglobin 12.0 - 15.0 g/dL 9.0(L) 9.6(L) 9.4(L)  Hematocrit 36.0 - 46.0 % 28.0(L) 29.1(L) 29.5(L)  Platelets 150 - 400 K/uL 403(H) 322 300   CMP Latest Ref Rng & Units 11/12/2019 11/11/2019 11/10/2019  Glucose 70 - 99 mg/dL - 962(X110(H) 528(U142(H)  BUN 6 - 20 mg/dL - <1(L<5(L) <2(G<5(L)  Creatinine 0.44 - 1.00 mg/dL - 4.01(U0.38(L) 2.720.49  Sodium 135 - 145 mmol/L - 139 139  Potassium 3.5 - 5.1 mmol/L - 3.7 3.3(L)  Chloride 98 - 111 mmol/L - 110 109  CO2 22 - 32 mmol/L - 18(L) 20(L)  Calcium 8.9 - 10.3 mg/dL - 8.6(L) 8.6(L)  Total Protein 6.5 - 8.1 g/dL 7.4 7.5 7.1  Total Bilirubin 0.3 - 1.2 mg/dL 5.3(G1.4(H) 1.1 1.1  Alkaline Phos 38 - 126 U/L 203(H) 182(H) 137(H)  AST 15 - 41 U/L 71(H) 117(H) 78(H)  ALT 0 - 44 U/L 122(H) 128(H) 114(H)    Micro Results: Recent Results (from the past 240 hour(s))  Urine Culture     Status: Abnormal   Collection Time:  11/08/19  7:24 PM   Specimen: Urine, Random  Result Value Ref Range Status   Specimen Description   Final    URINE, RANDOM Performed at [facility], [address] [place], [place] [address]    Special Requests   Final    NONE Performed at [facility], [address] [place], [place] [address]    Culture (A)  Final    30,000 COLONIES/mL MULTIPLE SPECIES PRESENT, SUGGEST RECOLLECTION   Report Status 11/10/2019 FINAL  Final  Culture, blood (routine x 2)     Status: None (Preliminary result)   Collection Time: 11/08/19  9:00 PM   Specimen: BLOOD  Result Value Ref Range Status   Specimen Description BLOOD LEFT ARM  Final   Special Requests   Final    BOTTLES DRAWN AEROBIC AND ANAEROBIC Blood Culture adequate volume   Culture   Final    NO GROWTH 4 DAYS Performed at [facility], [address] [place], [place] [address]    Report Status PENDING  Incomplete  SARS CORONAVIRUS 2 (TAT 6-24 HRS) Nasopharyngeal Nasopharyngeal Swab     Status: None   Collection Time: 11/08/19  9:00 PM   Specimen: Nasopharyngeal Swab  Result Value Ref Range Status   SARS Coronavirus 2 NEGATIVE NEGATIVE Final    Comment: (NOTE) SARS-CoV-2 target nucleic acids are NOT DETECTED. The SARS-CoV-2 RNA is generally detectable in upper and lower respiratory specimens during the acute phase of infection. Negative results do not preclude SARS-CoV-2 infection, do not rule out co-infections with other pathogens, and should not be used as the sole basis for treatment or other patient management decisions. Negative results must be combined with clinical observations, patient history, and epidemiological information. The expected result is Negative. Fact Sheet for Patients: SugarRoll.be Fact Sheet for Healthcare Providers: https://www.woods-mathews.com/ This test is not yet approved or cleared by the [place] FDA and  has been  authorized for detection and/or diagnosis of SARS-CoV-2 by FDA under an Emergency Use Authorization (EUA). This EUA will remain  in effect (meaning this test can be used) for the duration of the COVID-19 declaration under Section 56 4(b)(1) of the Act, 21 U.S.C. section 360bbb-3(b)(1), unless the authorization is terminated or revoked sooner. Performed at [facility], [place] [address], [place], [place] [address]   Culture, blood (routine x 2)     Status: None (Preliminary result)   Collection Time: 11/08/19  9:01 PM   Specimen: BLOOD  Result Value Ref Range Status   Specimen Description BLOOD LEFT ASSIST CONTROL  Final   Special Requests   Final    BOTTLES DRAWN AEROBIC AND ANAEROBIC Blood Culture adequate volume   Culture   Final    NO GROWTH 4 DAYS Performed at [facility], [address] [place], [place] [address]    Report Status PENDING  Incomplete  C difficile quick scan w PCR reflex     Status: None   Collection Time: 11/09/19 12:54 PM   Specimen: STOOL  Result Value Ref Range Status   C Diff antigen NEGATIVE NEGATIVE Final   C Diff toxin NEGATIVE NEGATIVE Final   C Diff interpretation No C. difficile detected.  Final    Comment: Performed at [facility], [address] [place], [place] [address]   Studies/Results: No results found. Medications:  I have reviewed the patient's current medications. Prior to Admission:  No medications prior to admission.   Scheduled: . enoxaparin (LOVENOX) injection  40 mg Subcutaneous QHS  . metoprolol succinate  12.5 mg Oral Daily   Continuous: . sodium chloride 250 mL (11/10/19 0709)  . 0.9 % NaCl with KCl 20 mEq / L 100 mL/hr at 11/12/19 1318   GXQ:JJHERD chloride, ibuprofen, ondansetron **OR** ondansetron (ZOFRAN) IV Anti-infectives (From admission, onward)   Start     Dose/Rate Route Frequency Ordered Stop   11/10/19 1115  azithromycin (ZITHROMAX) 500 mg in sodium chloride 0.9 % 250 mL IVPB   Status:  Discontinued     500 mg 250 mL/hr over 60 Minutes Intravenous Every 24 hours 11/10/19 1103 11/11/19 1353   11/09/19 2200  piperacillin-tazobactam (ZOSYN) IVPB 3.375 g  Status:  Discontinued     3.375 g 12.5 mL/hr over 240 Minutes Intravenous Every 8 hours 11/09/19 2122 11/11/19 0825   11/08/19 2045  piperacillin-tazobactam (ZOSYN) IVPB 3.375 g     3.375 g 100 mL/hr over 30 Minutes Intravenous  Once 11/08/19 2031 11/08/19 2224     Scheduled Meds: . enoxaparin (LOVENOX) injection  40 mg Subcutaneous QHS  . metoprolol succinate  12.5 mg Oral Daily   Continuous Infusions: . sodium chloride 250 mL (11/10/19 0709)  .  0.9 % NaCl with KCl 20 mEq / L 100 mL/hr at 11/12/19 1318   PRN Meds:.sodium chloride, ibuprofen, ondansetron **OR** ondansetron (ZOFRAN) IV   Assessment: Principal Problem:   Fever Active Problems:   Atypical pneumonia   Transaminitis   Hypokalemia   Diarrhea  Plan: Elevated LFTs: Most likely secondary to severe sepsis, likely viral etiology LFTs are down trending, expect to improve Viral hepatitis serologies including HSV, CMV, EBV, hepatitis B, hepatitis C are pending Regular diet Avoid hepatotoxic agents  Severe microcytic anemia, no evidence of active GI bleed, etiology secondary to menorrhagia Has severe iron deficiency.  Avoid parenteral iron in setting of sepsis Recommend p.o. iron intake Follow-up with PCP and GYN as outpatient  Dr. Maximino Greenland will cover from tomorrow    LOS: 4 days   Jacqueline [name] 11/12/2019, 5:22 PM

## 2019-11-12 NOTE — Progress Notes (Signed)
Patient ID: Jacqueline [name], female   DOB: 12-13-2001, 18 y.o.   MRN: [id number]  PROGRESS NOTE    Jacqueline [name]  JXB:[id number] DOB: February 13, 2001 DOA: 11/08/2019 PCP: Patient, No Pcp Per    Brief Narrative:  Jacqueline [name] is a 18 y.o. F with no sig PMHx who presents with 1 week fever and abdominal discomfort.  Covid test was negative.  In the ED she was febrile, tachycardic and with leukocytosis.  CT of abdomen showed normal appearing appendix, patchy groundglass density in the left lung base which could represent atypical pneumonia.  Scattered right lower quadrant lymphadenopathies related to mesenteric adenitis.  Acid was normal.Acute hepatitis panel namely hepatitis B surface antigen, hepatitis C viral antibody, hepatitis A IgM and hepatitis B core IgM have been nonreactive.  On admission transaminases were elevated with AST of 251, ALT 215 and alkaline phosphatase of 181 and total bilirubin 1.7.  HIV test has been negative.  Chest x-ray was clear, ultrasound liver was unremarkable.  She was started on empiric Zosyn for possible colitis and IV fluids and the hospitalist service was called for admission and evaluation.    Consultants:   GI  Procedures:  CT abdomen and pelvis on 11/08/2019 Normal-appearing appendix. Patchy ground-glass density in the left lung base which could represent atypical pneumonia. Correlation with COVID-19 testing is recommended. Scattered right lower quadrant lymphadenopathy which may be related to mesenteric adenitis.  Echo 11/28 1. Left ventricular ejection fraction, by visual estimation, is 60 to 65%. The left ventricle has normal function. There is no left ventricular hypertrophy.  2. Global right ventricle has normal systolic function.The right ventricular size is normal. No increase in right ventricular wall thickness.  3. TR signal is inadequate for assessing pulmonary artery systolic pressure.  Antimicrobials:    Zosyn and azithromycin discontinued 11/11/2019   Subjective: Patient does report feeling her tachycardia and states all of a sudden it will go up and then slows down.  Telemetry again reveals sinus tachycardia.  T-max 100.7 early this morning  Objective: Vitals:   11/12/19 1013 11/12/19 1016 11/12/19 1031 11/12/19 1040  BP: 127/76 127/75 127/70   Pulse: (!) 124 (!) 133 (!) 120 (!) 108  Resp:      Temp: 98.5 F (36.9 C) 98.5 F (36.9 C)    TempSrc: Oral Oral    SpO2: 95% 97% 96%   Weight:      Height:        Intake/Output Summary (Last 24 hours) at 11/12/2019 1310 Last data filed at 11/12/2019 1220 Gross per 24 hour  Intake 483.53 ml  Output 2700 ml  Net -2216.47 ml   Filed Weights   11/08/19 1836 11/10/19 0238 11/11/19 0523  Weight: 52.2 kg 50.1 kg 50.3 kg    Examination: General exam: Lying in bed in better mood today since mother visited yesterday, no acute distress Respiratory system: Clear to auscultation, no r/w/r cardiovascular system: S1 & S2 heard, tacky, regular  No JVD, murmurs, rubs, gallops or clicks.  Gastrointestinal system: Abdomen is nondistended, soft and nontender.  +bs No rebound or guarding Central nervous system: Alert and oriented x3. No focal neurological deficits. Extremities: No edema or cyanosis Skin: Warm and dry Psychiatry: Judgement and insight appear normal. Mood & affect appropriate.     Data Reviewed: I have personally reviewed following labs and imaging studies  CBC: Recent Labs  Lab 11/08/19 1840 11/09/19 0128 11/09/19 2121 11/10/19 0006 11/11/19 0526  WBC 15.6* 14.7* 15.2*  15.7* 15.9*  NEUTROABS  --   --  13.6*  --   --   HGB 12.0 11.7* 10.1* 9.4* 9.6*  HCT 37.3 36.5 30.6* 29.5* 29.1*  MCV 77.7* 77.8* 75.0* 77.8* 76.0*  PLT 356 340 317 300 229   Basic Metabolic Panel: Recent Labs  Lab 11/08/19 1840 11/09/19 0128 11/09/19 0619 11/10/19 0006 11/11/19 0526  NA 135 139 140 139 139  K 2.9* 2.7* 3.5 3.3* 3.7  CL 99  104 107 109 110  CO2 24 22 23  20* 18*  GLUCOSE 151* 109* 105* 142* 110*  BUN 6 <5* <5* <5* <5*  CREATININE 0.58 0.45 0.40* 0.49 0.38*  CALCIUM 9.4 8.8* 8.7* 8.6* 8.6*  MG  --  1.9 1.9  --   --    GFR: Estimated Creatinine Clearance: 77.1 mL/min (A) (by C-G formula based on SCr of 0.38 mg/dL (L)). Liver Function Tests: Recent Labs  Lab 11/08/19 1840 11/09/19 0128 11/10/19 0006 11/11/19 0526 11/12/19 0546  AST 251* 126* 78* 117* 71*  ALT 215* 176* 114* 128* 122*  ALKPHOS 181* 176* 137* 182* 203*  BILITOT 1.5* 1.7* 1.1 1.1 1.4*  PROT 8.8* 8.8* 7.1 7.5 7.4  ALBUMIN 4.0 4.1 3.1* 3.1* 3.0*   Recent Labs  Lab 11/08/19 1840  LIPASE 37   No results for input(s): AMMONIA in the last 168 hours. Coagulation Profile: Recent Labs  Lab 11/09/19 1136  INR 1.1   Cardiac Enzymes: No results for input(s): CKTOTAL, CKMB, CKMBINDEX, TROPONINI in the last 168 hours. BNP (last 3 results) No results for input(s): PROBNP in the last 8760 hours. HbA1C: No results for input(s): HGBA1C in the last 72 hours. CBG: No results for input(s): GLUCAP in the last 168 hours. Lipid Profile: No results for input(s): CHOL, HDL, LDLCALC, TRIG, CHOLHDL, LDLDIRECT in the last 72 hours. Thyroid Function Tests: No results for input(s): TSH, T4TOTAL, FREET4, T3FREE, THYROIDAB in the last 72 hours. Anemia Panel: No results for input(s): VITAMINB12, FOLATE, FERRITIN, TIBC, IRON, RETICCTPCT in the last 72 hours. Sepsis Labs: Recent Labs  Lab 11/08/19 2100 11/09/19 0128 11/09/19 2121 11/10/19 0005 11/12/19 1056  PROCALCITON  --   --   --   --  0.15  LATICACIDVEN 0.9 1.0 1.0 1.7  --     Recent Results (from the past 240 hour(s))  Urine Culture     Status: Abnormal   Collection Time: 11/08/19  7:24 PM   Specimen: Urine, Random  Result Value Ref Range Status   Specimen Description   Final    URINE, RANDOM Performed at [facility], [address] [place], [place] [address]    Special  Requests   Final    NONE Performed at [facility], [address] [place], [place] [address]    Culture (A)  Final    30,000 COLONIES/mL MULTIPLE SPECIES PRESENT, SUGGEST RECOLLECTION   Report Status 11/10/2019 FINAL  Final  Culture, blood (routine x 2)     Status: None (Preliminary result)   Collection Time: 11/08/19  9:00 PM   Specimen: BLOOD  Result Value Ref Range Status   Specimen Description BLOOD LEFT ARM  Final   Special Requests   Final    BOTTLES DRAWN AEROBIC AND ANAEROBIC Blood Culture adequate volume   Culture   Final    NO GROWTH 4 DAYS Performed at [facility], [address], [place],  [address]    Report Status PENDING  Incomplete  SARS CORONAVIRUS 2 (TAT  6-24 HRS) Nasopharyngeal Nasopharyngeal Swab     Status: None   Collection Time: 11/08/19  9:00 PM   Specimen: Nasopharyngeal Swab  Result Value Ref Range Status   SARS Coronavirus 2 NEGATIVE NEGATIVE Final    Comment: (NOTE) SARS-CoV-2 target nucleic acids are NOT DETECTED. The SARS-CoV-2 RNA is generally detectable in upper and lower respiratory specimens during the acute phase of infection. Negative results do not preclude SARS-CoV-2 infection, do not rule out co-infections with other pathogens, and should not be used as the sole basis for treatment or other patient management decisions. Negative results must be combined with clinical observations, patient history, and epidemiological information. The expected result is Negative. Fact Sheet for Patients: HairSlick.no Fact Sheet for Healthcare Providers: quierodirigir.com This test is not yet approved or cleared by the [place] FDA and  has been authorized for detection and/or diagnosis of SARS-CoV-2 by FDA under an Emergency Use Authorization (EUA). This EUA will remain  in effect (meaning this test can be used) for the duration of the COVID-19 declaration under  Section 56 4(b)(1) of the Act, 21 U.S.C. section 360bbb-3(b)(1), unless the authorization is terminated or revoked sooner. Performed at [facility], [address] [place], [place] [address]   Culture, blood (routine x 2)     Status: None (Preliminary result)   Collection Time: 11/08/19  9:01 PM   Specimen: BLOOD  Result Value Ref Range Status   Specimen Description BLOOD LEFT ASSIST CONTROL  Final   Special Requests   Final    BOTTLES DRAWN AEROBIC AND ANAEROBIC Blood Culture adequate volume   Culture   Final    NO GROWTH 4 DAYS Performed at [facility], [address], [place], [place] [address]    Report Status PENDING  Incomplete  C difficile quick scan w PCR reflex     Status: None   Collection Time: 11/09/19 12:54 PM   Specimen: STOOL  Result Value Ref Range Status   C Diff antigen NEGATIVE NEGATIVE Final   C Diff toxin NEGATIVE NEGATIVE Final   C Diff interpretation No C. difficile detected.  Final    Comment: Performed at [facility], [address] [place], [place] [address]         Radiology Studies: No results found.      Scheduled Meds: . enoxaparin (LOVENOX) injection  40 mg Subcutaneous QHS  . metoprolol succinate  12.5 mg Oral Daily   Continuous Infusions: . sodium chloride 250 mL (11/10/19 0709)  . 0.9 % NaCl with KCl 20 mEq / L 100 mL/hr at 11/12/19 0300    Assessment & Plan:   Principal Problem:   Fever Active Problems:   Atypical pneumonia   Transaminitis   Hypokalemia   Diarrhea   Fever/sepsis- etiology unclear. Possibly due to atypic pna. Tachycardic and leukocytosis.   EKG sinus tach Blood cultures urine cultures so far negative final results pending On Zosyn and azithro for presumed pna...> Was discontinued on 11/29 after speaking to ID via phone since likely this is viral etiology CMV, EBV , HIV pcr rna pending ID consulted today. ID rec. Ck for flu also.  Tachycardia-appears to be sinus  tachycardia on telemetry. Side-lined cardiology, this possibly could be inappropriate sinus tachycardia in a young female Possibly due to underlying infection Continue supportive care Echo with nml ef. We will add Toprol-XL 12.5 mg daily  Transaminitis Etiology unclear.  Possibly due to viral infection GI following LFTs was trending down again  Again we will check CMV, EBV, HIV as above.  GI also added hepatitis B and C viral loads to be followed.  This could be seen to be raised in her early infection 1 day IgM antibody is yet to become positive.     Atypical pneumonia on CT  covid test neg.  Per pt everyone at her job was tested also and was negative abx d/c'd as likely viral etiology and pt remains febrile. See above    Hypokalemia Resolved with replacement Continue to monitor  Diarrhea Resolved  Possibly 2/2 viral gastroenteritis. C.diff neg   DVT prophylaxis: lovenox Code Status: Full Family Communication: None at bedside Disposition Plan: Likely be here 1-2 more days until clinically more stable    LOS: 4 days   Time spent: 45 minutes with more than 50% on COC    Jacqueline ItoSahar Tani Virgo, MD [facility] Pager [phone]  If 7PM-7AM, please contact night-coverage [email or web address] Password [email or web address] 11/12/2019, 1:10 PM Patient ID: Jacqueline [name], female   DOB: 12/02/2001, 18 y.o.   MRN: [id number]

## 2019-11-13 DIAGNOSIS — D509 Iron deficiency anemia, unspecified: Secondary | ICD-10-CM

## 2019-11-13 LAB — CULTURE, BLOOD (ROUTINE X 2)
Culture: NO GROWTH
Culture: NO GROWTH
Special Requests: ADEQUATE
Special Requests: ADEQUATE

## 2019-11-13 LAB — HEPATITIS B DNA, ULTRAQUANTITATIVE, PCR
HBV DNA SERPL PCR-ACNC: NOT DETECTED IU/mL
HBV DNA SERPL PCR-LOG IU: UNDETERMINED log10 IU/mL

## 2019-11-13 LAB — RESPIRATORY PANEL BY PCR

## 2019-11-13 LAB — CMV IGM: CMV IgM: <30 AU/mL (ref 0.0–29.9)

## 2019-11-13 LAB — PATHOLOGIST SMEAR REVIEW

## 2019-11-13 LAB — PROCALCITONIN: Procalcitonin: 0.15 ng/mL

## 2019-11-13 MED ORDER — FERROUS SULFATE 325 (65 FE) MG PO TABS: 325.0000 mg | ORAL_TABLET | Freq: Every day | ORAL | Status: DC

## 2019-11-13 NOTE — Progress Notes (Signed)
Went to see patient- she has left AMA

## 2019-11-13 NOTE — Progress Notes (Signed)
Patient ID: Jacqueline [name], female   DOB: Oct 10, 2001, 18 y.o.   MRN: [id number]  PROGRESS NOTE    Jacqueline [name]  UXL:[id number] DOB: [birth year] DOA: 11/08/2019 PCP: Patient, No Pcp Per    Brief Narrative:  Jacqueline [name] is a 18 y.o. F with no sig PMHx who presents with 1 week fever and abdominal discomfort.  Covid test was negative.  In the ED she was febrile, tachycardic and with leukocytosis.  CT of abdomen showed normal appearing appendix, patchy groundglass density in the left lung base which could represent atypical pneumonia.  Scattered right lower quadrant lymphadenopathies related to mesenteric adenitis.  Acid was normal.Acute hepatitis panel namely hepatitis B surface antigen, hepatitis C viral antibody, hepatitis A IgM and hepatitis B core IgM have been nonreactive.  On admission transaminases were elevated with AST of 251, ALT 215 and alkaline phosphatase of 181 and total bilirubin 1.7.  HIV test has been negative.  Chest x-ray was clear, ultrasound liver was unremarkable.  She was started on empiric Zosyn for possible colitis and IV fluids and the hospitalist service was called for admission and evaluation.    Consultants:   GI  ID  Procedures:  CT abdomen and pelvis on 11/08/2019 Normal-appearing appendix. Patchy ground-glass density in the left lung base which could represent atypical pneumonia. Correlation with COVID-19 testing is recommended. Scattered right lower quadrant lymphadenopathy which may be related to mesenteric adenitis.  Echo 11/28 1. Left ventricular ejection fraction, by visual estimation, is 60 to 65%. The left ventricle has normal function. There is no left ventricular hypertrophy.  2. Global right ventricle has normal systolic function.The right ventricular size is normal. No increase in right ventricular wall thickness.  3. TR signal is inadequate for assessing pulmonary artery systolic pressure.  Antimicrobials:    Zosyn and azithromycin discontinued 11/11/2019   Subjective: Pt febrile again, still tachycardic episodically but better. She reports no sob, cp, chills, n/v/abd pain/diarrhea.   Objective: Vitals:   11/13/19 0159 11/13/19 0303 11/13/19 0542 11/13/19 0834  BP:   140/84 128/76  Pulse: (!) 113 (!) 104 (!) 124 (!) 109  Resp:    18  Temp:   100 F (37.8 C) 98.6 F (37 C)  TempSrc:   Oral Oral  SpO2:   96% 96%  Weight:      Height:        Intake/Output Summary (Last 24 hours) at 11/13/2019 1332 Last data filed at 11/13/2019 5366 Gross per 24 hour  Intake 3086.54 ml  Output 2950 ml  Net 136.54 ml   Filed Weights   11/08/19 1836 11/10/19 0238 11/11/19 0523  Weight: 52.2 kg 50.1 kg 50.3 kg    Examination: General exam: Lying in bed in better mood today since mother visited yesterday, no acute distress Respiratory system: Clear to auscultation, no r/w/r cardiovascular system: Regular, tachycardic S1 & S2 No  murmurs, rubs, gallops or clicks.  Gastrointestinal system: soft nondistended, soft and nontender.  +bs No rebound or guarding Central nervous system: Alert and oriented x3. No focal neurological deficits. Extremities: No edema or cyanosis Skin: Warm and dry Psychiatry: Judgement and insight appear normal. Mood & affect appropriate.     Data Reviewed: I have personally reviewed following labs and imaging studies  CBC: Recent Labs  Lab 11/09/19 0128 11/09/19 2121 11/10/19 0006 11/11/19 0526 11/12/19 1446  WBC 14.7* 15.2* 15.7* 15.9* 15.9*  NEUTROABS  --  13.6*  --   --  13.7*  HGB 11.7* 10.1* 9.4* 9.6* 9.0*  HCT 36.5 30.6* 29.5* 29.1* 28.0*  MCV 77.8* 75.0* 77.8* 76.0* 77.1*  PLT 340 317 300 322 403*   Basic Metabolic Panel: Recent Labs  Lab 11/08/19 1840 11/09/19 0128 11/09/19 0619 11/10/19 0006 11/11/19 0526  NA 135 139 140 139 139  K 2.9* 2.7* 3.5 3.3* 3.7  CL 99 104 107 109 110  CO2 24 22 23  20* 18*  GLUCOSE 151* 109* 105* 142* 110*  BUN 6 <5*  <5* <5* <5*  CREATININE 0.58 0.45 0.40* 0.49 0.38*  CALCIUM 9.4 8.8* 8.7* 8.6* 8.6*  MG  --  1.9 1.9  --   --    GFR: Estimated Creatinine Clearance: 77.1 mL/min (A) (by C-G formula based on SCr of 0.38 mg/dL (L)). Liver Function Tests: Recent Labs  Lab 11/08/19 1840 11/09/19 0128 11/10/19 0006 11/11/19 0526 11/12/19 0546  AST 251* 126* 78* 117* 71*  ALT 215* 176* 114* 128* 122*  ALKPHOS 181* 176* 137* 182* 203*  BILITOT 1.5* 1.7* 1.1 1.1 1.4*  PROT 8.8* 8.8* 7.1 7.5 7.4  ALBUMIN 4.0 4.1 3.1* 3.1* 3.0*   Recent Labs  Lab 11/08/19 1840  LIPASE 37   No results for input(s): AMMONIA in the last 168 hours. Coagulation Profile: Recent Labs  Lab 11/09/19 1136  INR 1.1   Cardiac Enzymes: No results for input(s): CKTOTAL, CKMB, CKMBINDEX, TROPONINI in the last 168 hours. BNP (last 3 results) No results for input(s): PROBNP in the last 8760 hours. HbA1C: No results for input(s): HGBA1C in the last 72 hours. CBG: No results for input(s): GLUCAP in the last 168 hours. Lipid Profile: No results for input(s): CHOL, HDL, LDLCALC, TRIG, CHOLHDL, LDLDIRECT in the last 72 hours. Thyroid Function Tests: No results for input(s): TSH, T4TOTAL, FREET4, T3FREE, THYROIDAB in the last 72 hours. Anemia Panel: Recent Labs    11/12/19 0546  FERRITIN 181   Sepsis Labs: Recent Labs  Lab 11/08/19 2100 11/09/19 0128 11/09/19 2121 11/10/19 0005 11/12/19 1056 11/13/19 0610  PROCALCITON  --   --   --   --  0.15 0.15  LATICACIDVEN 0.9 1.0 1.0 1.7  --   --     Recent Results (from the past 240 hour(s))  Urine Culture     Status: Abnormal   Collection Time: 11/08/19  7:24 PM   Specimen: Urine, Random  Result Value Ref Range Status   Specimen Description   Final    URINE, RANDOM Performed at [facility], [address] [place], [place] [address]    Special Requests   Final    NONE Performed at [facility], [address] [place], [place] [address]     Culture (A)  Final    30,000 COLONIES/mL MULTIPLE SPECIES PRESENT, SUGGEST RECOLLECTION   Report Status 11/10/2019 FINAL  Final  Culture, blood (routine x 2)     Status: None   Collection Time: 11/08/19  9:00 PM   Specimen: BLOOD  Result Value Ref Range Status   Specimen Description BLOOD LEFT ARM  Final   Special Requests   Final    BOTTLES DRAWN AEROBIC AND ANAEROBIC Blood Culture adequate volume   Culture   Final    NO GROWTH 5 DAYS Performed at [facility], [address] [place], [place] [address]    Report Status 11/13/2019 FINAL  Final  SARS CORONAVIRUS 2 (TAT 6-24 HRS) Nasopharyngeal Nasopharyngeal Swab     Status: None   Collection Time: 11/08/19  9:00 PM   Specimen: Nasopharyngeal Swab  Result Value Ref Range Status   SARS Coronavirus 2 NEGATIVE NEGATIVE Final    Comment: (NOTE) SARS-CoV-2 target nucleic acids are NOT DETECTED. The SARS-CoV-2 RNA is generally detectable in upper and lower respiratory specimens during the acute phase of infection. Negative results do not preclude SARS-CoV-2 infection, do not rule out co-infections with other pathogens, and should not be used as the sole basis for treatment or other patient management decisions. Negative results must be combined with clinical observations, patient history, and epidemiological information. The expected result is Negative. Fact Sheet for Patients: HairSlick.no Fact Sheet for Healthcare Providers: quierodirigir.com This test is not yet approved or cleared by the [place] FDA and  has been authorized for detection and/or diagnosis of SARS-CoV-2 by FDA under an Emergency Use Authorization (EUA). This EUA will remain  in effect (meaning this test can be used) for the duration of the COVID-19 declaration under Section 56 4(b)(1) of the Act, 21 U.S.C. section 360bbb-3(b)(1), unless the authorization is terminated or revoked sooner.  Performed at [facility], [address], [place], [place] [address]   Culture, blood (routine x 2)     Status: None   Collection Time: 11/08/19  9:01 PM   Specimen: BLOOD  Result Value Ref Range Status   Specimen Description BLOOD LEFT ASSIST CONTROL  Final   Special Requests   Final    BOTTLES DRAWN AEROBIC AND ANAEROBIC Blood Culture adequate volume   Culture   Final    NO GROWTH 5 DAYS Performed at [facility], [address] [place], [place] [address]    Report Status 11/13/2019 FINAL  Final  C difficile quick scan w PCR reflex     Status: None   Collection Time: 11/09/19 12:54 PM   Specimen: STOOL  Result Value Ref Range Status   C Diff antigen NEGATIVE NEGATIVE Final   C Diff toxin NEGATIVE NEGATIVE Final   C Diff interpretation No C. difficile detected.  Final    Comment: Performed at [facility], [address] [place], [place] [address]  Respiratory Panel by PCR     Status: None   Collection Time: 11/12/19 10:30 AM   Specimen: Nasopharyngeal Swab; Respiratory  Result Value Ref Range Status   Adenovirus NOT DETECTED NOT DETECTED Final   Coronavirus 229E NOT DETECTED NOT DETECTED Final    Comment: (NOTE) The Coronavirus on the Respiratory Panel, DOES NOT test for the novel  Coronavirus (2019 nCoV)    Coronavirus HKU1 NOT DETECTED NOT DETECTED Final   Coronavirus NL63 NOT DETECTED NOT DETECTED Final   Coronavirus OC43 NOT DETECTED NOT DETECTED Final   Metapneumovirus NOT DETECTED NOT DETECTED Final   Rhinovirus / Enterovirus NOT DETECTED NOT DETECTED Final   Influenza A NOT DETECTED NOT DETECTED Final   Influenza B NOT DETECTED NOT DETECTED Final   Parainfluenza Virus 1 NOT DETECTED NOT DETECTED Final   Parainfluenza Virus 2 NOT DETECTED NOT DETECTED Final   Parainfluenza Virus 3 NOT DETECTED NOT DETECTED Final   Parainfluenza Virus 4 NOT DETECTED NOT DETECTED Final   Respiratory Syncytial Virus NOT DETECTED NOT DETECTED Final    Bordetella pertussis NOT DETECTED NOT DETECTED Final   Chlamydophila pneumoniae NOT DETECTED NOT DETECTED Final   Mycoplasma pneumoniae NOT DETECTED NOT DETECTED Final    Comment: Performed at [facility], [address], [place], [place] [address]  CULTURE, BLOOD (ROUTINE X 2) w Reflex to  ID Panel     Status: None (Preliminary result)   Collection Time: 11/12/19 10:17 PM   Specimen: BLOOD  Result Value Ref Range Status   Specimen Description BLOOD RIGHT ANTECUBITAL  Final   Special Requests   Final    BOTTLES DRAWN AEROBIC AND ANAEROBIC Blood Culture adequate volume   Culture   Final    NO GROWTH < 12 HOURS Performed at [facility], [address], [place], [place] [address]    Report Status PENDING  Incomplete  CULTURE, BLOOD (ROUTINE X 2) w Reflex to ID Panel     Status: None (Preliminary result)   Collection Time: 11/12/19 10:18 PM   Specimen: BLOOD  Result Value Ref Range Status   Specimen Description BLOOD BLOOD LEFT HAND  Final   Special Requests   Final    BOTTLES DRAWN AEROBIC AND ANAEROBIC Blood Culture results may not be optimal due to an excessive volume of blood received in culture bottles   Culture   Final    NO GROWTH < 12 HOURS Performed at [name], [address] [place], [place] [address]    Report Status PENDING  Incomplete         Radiology Studies: No results found.      Scheduled Meds: . enoxaparin (LOVENOX) injection  40 mg Subcutaneous QHS  . metoprolol succinate  12.5 mg Oral Daily   Continuous Infusions: . sodium chloride 250 mL (11/10/19 0709)  . 0.9 % NaCl with KCl 20 mEq / L 100 mL/hr at 11/13/19 1159    Assessment & Plan:   Principal Problem:   Fever Active Problems:   Atypical pneumonia   Transaminitis   Hypokalemia   Diarrhea   Fever/sepsis- etiology unclear. Possibly due to atypic pna. Tachycardic and leukocytosis.   EKG sinus tach Blood cultures urine cultures so far negative final  results pending On Zosyn and azithro for presumed pna...> Was discontinued on 11/29 after speaking to ID via phone since likely this is viral etiology CMV, EBV , HIV pcr rna pending IDs input was appreciated-possibly patient could have Yersinia especially with GI symptoms and CT findings of adenopathy?  Will obtain peripheral smear to look for atypical lymphocytes, LDH, ferritin Flu was negative Continue to monitor off antibiotics  Tachycardia-appears to be sinus tachycardia on telemetry. Side-lined cardiology, this possibly could be inappropriate sinus tachycardia in a young female Possibly due to underlying infection Continue supportive care Echo with nml ef. Was started on Toprol-XL 12.5 mg daily  Transaminitis Etiology unclear.  Possibly due to viral infection GI following LFTs was trending down again Again we will check CMV, EBV, HIV as above.  GI also added hepatitis B and C viral loads to be followed.  This could be seen to be raised in her early infection 1 day IgM antibody is yet to become positive. Please see #1   microcytic anemia- no evidence of active GI bleed, etiology secondary to menorrhagia GI rec. adding iron po daily     Atypical pneumonia on CT  covid test neg.  Per pt everyone at her job was tested also and was negative abx d/c'd as likely viral etiology and pt remains febrile. See above    Hypokalemia Resolved with replacement Continue to monitor  Diarrhea Resolved  Possibly 2/2 viral gastroenteritis. C.diff neg   DVT prophylaxis: lovenox Code Status: Full Family Communication: None at bedside Disposition Plan: Likely be here 1-2 more days until clinically more stable, f/u lab results.  LOS: 5 days   Time spent: 45 minutes with more than 50% on COC    Lynn Ito, MD [facility] Pager [phone]  If 7PM-7AM, please contact night-coverage [email or web address] Password [email or web address] 11/13/2019, 1:32 PM Patient ID: Jacqueline [name], female   DOB: [birth year], 18 y.o.   MRN: [id number] Patient ID: Jacqueline [name], female   DOB: 10-12-2001, 18 y.o.   MRN: [id number]

## 2019-11-13 NOTE — Progress Notes (Signed)
Melodie BouillonVarnita Yaser Harvill, MD [address], [place], [place], [address] [address], [place], [place], [address] Phone: [phone]  Fax: [phone]   Subjective: Liver enzymes are improving.  No abdominal pain.   Objective: Exam: Vital signs in last 24 hours: Vitals:   11/13/19 0159 11/13/19 0303 11/13/19 0542 11/13/19 0834  BP:   140/84 128/76  Pulse: (!) 113 (!) 104 (!) 124 (!) 109  Resp:    18  Temp:   100 F (37.8 C) 98.6 F (37 C)  TempSrc:   Oral Oral  SpO2:   96% 96%  Weight:      Height:       Weight change:   Intake/Output Summary (Last 24 hours) at 11/13/2019 1358 Last data filed at 11/13/2019 [id number] Gross per 24 hour  Intake 3086.54 ml  Output 2950 ml  Net 136.54 ml    General: No acute distress, AAO x3 Abd: Soft, NT/ND, No HSM Skin: Warm, no rashes Neck: Supple, Trachea midline   Lab Results: Lab Results  Component Value Date   WBC 15.9 (H) 11/12/2019   HGB 9.0 (L) 11/12/2019   HCT 28.0 (L) 11/12/2019   MCV 77.1 (L) 11/12/2019   PLT 403 (H) 11/12/2019   Micro Results: Recent Results (from the past 240 hour(s))  Urine Culture     Status: Abnormal   Collection Time: 11/08/19  7:24 PM   Specimen: Urine, Random  Result Value Ref Range Status   Specimen Description   Final    URINE, RANDOM Performed at [facility], [address] [place], [place] [address]    Special Requests   Final    NONE Performed at [facility], [address] [place], [place] [address]    Culture (A)  Final    30,000 COLONIES/mL MULTIPLE SPECIES PRESENT, SUGGEST RECOLLECTION   Report Status 11/10/2019 FINAL  Final  Culture, blood (routine x 2)     Status: None   Collection Time: 11/08/19  9:00 PM   Specimen: BLOOD  Result Value Ref Range Status   Specimen Description BLOOD LEFT ARM  Final   Special Requests   Final    BOTTLES DRAWN AEROBIC AND ANAEROBIC Blood Culture adequate volume   Culture   Final    NO GROWTH 5 DAYS  Performed at [facility], [address] [place], [place] [address]    Report Status 11/13/2019 FINAL  Final  SARS CORONAVIRUS 2 (TAT 6-24 HRS) Nasopharyngeal Nasopharyngeal Swab     Status: None   Collection Time: 11/08/19  9:00 PM   Specimen: Nasopharyngeal Swab  Result Value Ref Range Status   SARS Coronavirus 2 NEGATIVE NEGATIVE Final    Comment: (NOTE) SARS-CoV-2 target nucleic acids are NOT DETECTED. The SARS-CoV-2 RNA is generally detectable in upper and lower respiratory specimens during the acute phase of infection. Negative results do not preclude SARS-CoV-2 infection, do not rule out co-infections with other pathogens, and should not be used as the sole basis for treatment or other patient management decisions. Negative results must be combined with clinical observations, patient history, and epidemiological information. The expected result is Negative. Fact Sheet for Patients: HairSlick.nohttps://www.fda.gov/media/138098/download Fact Sheet for Healthcare Providers: quierodirigir.comhttps://www.fda.gov/media/138095/download This test is not yet approved or cleared by the [place] FDA and  has been authorized for detection and/or diagnosis of SARS-CoV-2 by FDA under an Emergency Use Authorization (EUA). This EUA will remain  in effect (meaning this test can be used) for the duration  of the COVID-19 declaration under Section 56 4(b)(1) of the Act, 21 U.S.C. section 360bbb-3(b)(1), unless the authorization is terminated or revoked sooner. Performed at [facility], [place] [address] [place], [place] [address]   Culture, blood (routine x 2)     Status: None   Collection Time: 11/08/19  9:01 PM   Specimen: BLOOD  Result Value Ref Range Status   Specimen Description BLOOD LEFT ASSIST CONTROL  Final   Special Requests   Final    BOTTLES DRAWN AEROBIC AND ANAEROBIC Blood Culture adequate volume   Culture   Final    NO GROWTH 5 DAYS Performed at [facility], [address] [place], [place] [address]    Report Status 11/13/2019 FINAL  Final  C difficile quick scan w PCR reflex     Status: None   Collection Time: 11/09/19 12:54 PM   Specimen: STOOL  Result Value Ref Range Status   C Diff antigen NEGATIVE NEGATIVE Final   C Diff toxin NEGATIVE NEGATIVE Final   C Diff interpretation No C. difficile detected.  Final    Comment: Performed at [facility], [address] [place], [place] [address]  Respiratory Panel by PCR     Status: None   Collection Time: 11/12/19 10:30 AM   Specimen: Nasopharyngeal Swab; Respiratory  Result Value Ref Range Status   Adenovirus NOT DETECTED NOT DETECTED Final   Coronavirus 229E NOT DETECTED NOT DETECTED Final    Comment: (NOTE) The Coronavirus on the Respiratory Panel, DOES NOT test for the novel  Coronavirus (2019 nCoV)    Coronavirus HKU1 NOT DETECTED NOT DETECTED Final   Coronavirus NL63 NOT DETECTED NOT DETECTED Final   Coronavirus OC43 NOT DETECTED NOT DETECTED Final   Metapneumovirus NOT DETECTED NOT DETECTED Final   Rhinovirus / Enterovirus NOT DETECTED NOT DETECTED Final   Influenza A NOT DETECTED NOT DETECTED Final   Influenza B NOT DETECTED NOT DETECTED Final   Parainfluenza Virus 1 NOT DETECTED NOT DETECTED Final   Parainfluenza Virus 2 NOT DETECTED NOT DETECTED Final   Parainfluenza Virus 3 NOT DETECTED NOT DETECTED Final   Parainfluenza Virus 4 NOT DETECTED NOT DETECTED Final   Respiratory Syncytial Virus NOT DETECTED NOT DETECTED Final   Bordetella pertussis NOT DETECTED NOT DETECTED Final   Chlamydophila pneumoniae NOT DETECTED NOT DETECTED Final   Mycoplasma pneumoniae NOT DETECTED NOT DETECTED Final    Comment: Performed at [facility], [place], [place], [place] [address]  CULTURE, BLOOD (ROUTINE X 2) w Reflex to ID Panel     Status: None (Preliminary result)   Collection Time: 11/12/19 10:17 PM   Specimen: BLOOD  Result Value Ref Range Status   Specimen  Description BLOOD RIGHT ANTECUBITAL  Final   Special Requests   Final    BOTTLES DRAWN AEROBIC AND ANAEROBIC Blood Culture adequate volume   Culture   Final    NO GROWTH < 12 HOURS Performed at [facility], [address], [place], [place] [address]    Report Status PENDING  Incomplete  CULTURE, BLOOD (ROUTINE X 2) w Reflex to ID Panel     Status: None (Preliminary result)   Collection Time: 11/12/19 10:18 PM   Specimen: BLOOD  Result Value Ref Range Status   Specimen Description BLOOD BLOOD LEFT HAND  Final   Special Requests   Final    BOTTLES DRAWN AEROBIC AND ANAEROBIC Blood Culture results may not be optimal due to  an excessive volume of blood received in culture bottles   Culture   Final    NO GROWTH < 12 HOURS Performed at [facility], [address] [place], [place] [address]    Report Status PENDING  Incomplete   Studies/Results: No results found. Medications:  Scheduled Meds: . enoxaparin (LOVENOX) injection  40 mg Subcutaneous QHS  . [START ON 11/14/2019] ferrous sulfate  325 mg Oral Q breakfast  . metoprolol succinate  12.5 mg Oral Daily   Continuous Infusions: . sodium chloride 250 mL (11/10/19 0709)  . 0.9 % NaCl with KCl 20 mEq / L 100 mL/hr at 11/13/19 1159   PRN Meds:.sodium chloride, ibuprofen, ondansetron **OR** ondansetron (ZOFRAN) IV   Assessment: Elevated liver enzymes  Plan: Liver enzymes have improved as of yesterday Please order with morning labs for tomorrow  Acute hepatitis panel was negative  Other work-up for elevated liver enzymes is still pending  Avoid hepatotoxic drugs  Improvement in liver enzymes is also suggestive that elevation was likely due to to sepsis picture that she came in with  Acetaminophen level is slightly above normal at 31.  Patient denies any alcohol use.  Reports taking NyQuil, 2 doses daily for 2 weeks due to abdominal pain.  Her NyQuil could have had acetaminophen in it.  The level is not  high enough to be able to treat with NAC.  Liver enzymes are improving and no indication for NAC treatment at this time anyway.  Avoid all hepatotoxic drugs.  Patient reports heavy periods and due to her iron deficiency should get a hematology consult as an inpatient or outpatient.  Follow-up closely with GYN in this regard as well   LOS: 5 days   Melodie Bouillon, MD 11/13/2019, 1:58 PM

## 2019-11-13 NOTE — Progress Notes (Signed)
Dr. Hulen Skains about patient temp and heart rate. Order for bolus given, will continue to monitor.

## 2019-11-13 NOTE — Progress Notes (Signed)
Patient has left AMA at this time. This RN reiterated the importance of staying/need to await test results and consult from ID after speaking with MD. Patient stated that her and her mother have "already decided, and will take care of it." AMA form signed by patient, fluids stopped, IV and tele monitor removed.

## 2019-11-14 LAB — EPSTEIN-BARR VIRUS (EBV) ANTIBODY PROFILE
EBV NA IgG: 447 U/mL — ABNORMAL HIGH (ref 0.0–17.9)
EBV VCA IgG: 467 U/mL — ABNORMAL HIGH (ref 0.0–17.9)
EBV VCA IgM: <36 U/mL (ref 0.0–35.9)

## 2019-11-14 LAB — HIV-1 RNA, PCR (GRAPH) RFX/GENO EDI
HIV-1 RNA BY PCR: <20 copies/mL
HIV-1 RNA Quant, Log: UNDETERMINED log10copy/mL

## 2019-11-14 LAB — LEGIONELLA PNEUMOPHILA SEROGP 1 UR AG: L. pneumophila Serogp 1 Ur Ag: NEGATIVE

## 2019-11-14 LAB — EBV AB TO VIRAL CAPSID AG PNL, IGG+IGM
EBV VCA IgG: 425 U/mL — ABNORMAL HIGH (ref 0.0–17.9)
EBV VCA IgM: <36 U/mL (ref 0.0–35.9)

## 2019-11-14 LAB — HEPATITIS C VRS RNA DETECT BY PCR-QUAL: Hepatitis C Vrs RNA by PCR-Qual: NEGATIVE

## 2019-11-15 LAB — EPSTEIN-BARR VIRUS (EBV) ANTIBODY PROFILE
EBV NA IgG: 414 U/mL — ABNORMAL HIGH (ref 0.0–17.9)
EBV VCA IgG: 243 U/mL — ABNORMAL HIGH (ref 0.0–17.9)
EBV VCA IgM: <36 U/mL (ref 0.0–35.9)

## 2019-11-17 LAB — CULTURE, BLOOD (ROUTINE X 2)
Culture: NO GROWTH
Culture: NO GROWTH
Special Requests: ADEQUATE

## 2019-11-27 NOTE — Discharge Summary (Signed)
Jacqueline [name] YNW:[id number] DOB: 2001-04-01 DOA: 11/08/2019  PCP: Patient, No Pcp Per  Admit date: 11/08/2019 Discharge date: 11/27/2019  Admitted From: home Disposition:  Pt signed out AMA  Discharge Condition:Stable CODE STATUS:full Diet recommendation:   Brief/Interim Summary: Jacqueline [name] a 18 y.o.Fwith no sig PMHxwho presents with 1 week fever and abdominal discomfort.  Covid test was negative.  In the ED she was febrile, tachycardic and with leukocytosis.  CT of abdomen showed normal appearing appendix, patchy groundglass density in the left lung base which could represent atypical pneumonia.  Scattered right lower quadrant lymphadenopathies related to mesenteric adenitis.  Acid was normal.Acute hepatitis panel namely hepatitis B surface antigen, hepatitis C viral antibody, hepatitis A IgM and hepatitis B core IgM have been nonreactive. On admission transaminases were elevated with AST of 251, ALT 215 and alkaline phosphatase of 181 and total bilirubin 1.7.  HIV test has been negative.  Chest x-ray was clear, ultrasound liver was unremarkable.  She was started on empiric Zosyn for possible colitis and IV fluids and the hospitalist service was called for admission and evaluation.   Patient continued having tachycardia leukocytosis and fever.  Etiology of her fever was unclear.  ID was consulted.  Cultures are so far negative.  We also obtained echo which was pending.  However patient in the evening decided to leave AMA.  Please note I was not in-house during that time.  Discharge Diagnoses:  Principal Problem:   Fever Active Problems:   Atypical pneumonia   Transaminitis   Hypokalemia   Diarrhea   Microcytic anemia    Discharge Instructions   Allergies as of 11/13/2019      Reactions   Avocado Other (See Comments)   Tongue swelling      Medication List    You have not been prescribed any medications.     Allergies  Allergen Reactions   . Avocado Other (See Comments)    Tongue swelling     Consultations:     Procedures/Studies: CT Abdomen Pelvis W Contrast  Result Date: 11/08/2019 CLINICAL DATA:  Fevers EXAM: CT ABDOMEN AND PELVIS WITH CONTRAST TECHNIQUE: Multidetector CT imaging of the abdomen and pelvis was performed using the standard protocol following bolus administration of intravenous contrast. CONTRAST:  75mL OMNIPAQUE IOHEXOL 300 MG/ML  SOLN COMPARISON:  None. FINDINGS: Lower chest: Lung bases demonstrate minimal dependent atelectatic changes. Additionally in the left lung base there is some patchy ground-glass density. This could represent atypical pneumonia. Hepatobiliary: No focal liver abnormality is seen. No gallstones, gallbladder wall thickening, or biliary dilatation. Pancreas: Unremarkable. No pancreatic ductal dilatation or surrounding inflammatory changes. Spleen: Normal in size without focal abnormality. Adrenals/Urinary Tract: Adrenal glands are within normal limits. The kidneys are well visualized bilaterally. No renal calculi or obstructive changes are noted. The bladder is well distended. Stomach/Bowel: Colon is decompressed without inflammatory change. The appendix is air-filled and within normal limits. No inflammatory changes are seen. Small bowel and stomach are within normal limits. Vascular/Lymphatic: No vascular abnormality is seen. Scattered small right lower quadrant lymph nodes are noted which could be related to mesenteric adenitis. None of these measure over 1 cm in short axis. Reproductive: Uterus and bilateral adnexa are unremarkable. Other: No abdominal wall hernia or abnormality. No abdominopelvic ascites. Musculoskeletal: No acute or significant osseous findings. IMPRESSION: Normal-appearing appendix. Patchy ground-glass density in the left lung base which could represent atypical pneumonia. Correlation with COVID-19 testing is recommended. Scattered right lower quadrant lymphadenopathy  which  may be related to mesenteric adenitis. Electronically Signed   By: Alcide CleverMark  Lukens M.D.   On: 11/08/2019 21:21   DG Chest Port 1 View  Result Date: 11/08/2019 CLINICAL DATA:  FEVER EXAM: PORTABLE CHEST 1 VIEW COMPARISON:  07/18/2008 FINDINGS: The heart size and mediastinal contours are within normal limits. Both lungs are clear. The visualized skeletal structures are unremarkable. IMPRESSION: No active disease. Electronically Signed   By: Katherine Mantlehristopher  Green M.D.   On: 11/08/2019 19:36   ECHOCARDIOGRAM COMPLETE  Result Date: 11/10/2019   ECHOCARDIOGRAM REPORT   Patient Name:   Jacqueline [name] Date of Exam: 11/10/2019 Medical Rec #:  [id number]                  Height:       59.0 in Accession #:    [id number]                 Weight:       110.4 lb Date of Birth:  10/24/2001                  BSA:          1.43 m Patient Age:    18 years                   BP:           131/83 mmHg Patient Gender: F                          HR:           121 bpm. Exam Location:  [facility] Procedure: 2D Echo, Color Doppler and Cardiac Doppler Indications:    R94.31 Abnormal ECG  History:        Patient has no prior history of Echocardiogram examinations.                 Asthma.  Sonographer:    Humphrey RollsJoan Heiss RDCS (AE) Referring Phys: [id number] [facility] Jacqueline [name] Diagnosing      Julien Nordmannimothy Gollan MD Phys:  Sonographer Comments: Suboptimal apical window. IMPRESSIONS  1. Left ventricular ejection fraction, by visual estimation, is 60 to 65%. The left ventricle has normal function. There is no left ventricular hypertrophy.  2. Global right ventricle has normal systolic function.The right ventricular size is normal. No increase in right ventricular wall thickness.  3. TR signal is inadequate for assessing pulmonary artery systolic pressure. FINDINGS  Left Ventricle: Left ventricular ejection fraction, by visual estimation, is 60 to 65%. The left ventricle has normal function. There is no left ventricular hypertrophy. Left ventricular  diastolic parameters were normal. Normal left atrial pressure. Right Ventricle: The right ventricular size is normal. No increase in right ventricular wall thickness. Global RV systolic function is has normal systolic function. Left Atrium: Left atrial size was normal in size. Right Atrium: Right atrial size was normal in size Pericardium: There is no evidence of pericardial effusion. Mitral Valve: The mitral valve is normal in structure. No evidence of mitral valve stenosis by observation. MV peak gradient, 7.4 mmHg. Mild mitral valve regurgitation. Tricuspid Valve: The tricuspid valve is normal in structure. Tricuspid valve regurgitation is mild. Aortic Valve: The aortic valve is normal in structure. Aortic valve regurgitation is not visualized. The aortic valve is structurally normal, with no evidence of sclerosis or stenosis. Aortic valve mean gradient measures 4.0 mmHg. Aortic valve peak gradient measures 8.2 mmHg. Aortic valve area, by VTI  measures 1.34 cm. Pulmonic Valve: The pulmonic valve was normal in structure. Pulmonic valve regurgitation is not visualized. Aorta: The aortic root, ascending aorta and aortic arch are all structurally normal, with no evidence of dilitation or obstruction. Venous: The inferior vena cava is normal in size with greater than 50% respiratory variability, suggesting right atrial pressure of 3 mmHg. IAS/Shunts: No atrial level shunt detected by color flow Doppler. No ventricular septal defect is seen or detected. There is no evidence of an atrial septal defect.  LEFT VENTRICLE PLAX 2D LVIDd:         4.25 cm  Diastology LVIDs:         2.50 cm  LV e' lateral:   19.90 cm/s LV PW:         0.60 cm  LV E/e' lateral: 6.7 LV IVS:        0.64 cm  LV e' medial:    13.60 cm/s LVOT diam:     1.50 cm  LV E/e' medial:  9.8 LV SV:         58 ml LV SV Index:   40.32 LVOT Area:     1.77 cm  RIGHT VENTRICLE RV Basal diam:  2.79 cm TAPSE (M-mode): 2.9 cm LEFT ATRIUM             Index       RIGHT  ATRIUM           Index LA diam:        2.80 cm 1.95 cm/m  RA Area:     10.40 cm LA Vol (A2C):   29.2 ml 20.38 ml/m RA Volume:   22.10 ml  15.42 ml/m LA Vol (A4C):   31.6 ml 22.06 ml/m LA Biplane Vol: 31.7 ml 22.13 ml/m  AORTIC VALVE                   PULMONIC VALVE AV Area (Vmax):    1.32 cm    PV Vmax:       1.22 m/s AV Area (Vmean):   1.34 cm    PV Vmean:      92.000 cm/s AV Area (VTI):     1.34 cm    PV VTI:        0.275 m AV Vmax:           143.00 cm/s PV Peak grad:  6.0 mmHg AV Vmean:          89.600 cm/s PV Mean grad:  4.0 mmHg AV VTI:            0.239 m AV Peak Grad:      8.2 mmHg AV Mean Grad:      4.0 mmHg LVOT Vmax:         107.00 cm/s LVOT Vmean:        68.000 cm/s LVOT VTI:          0.181 m LVOT/AV VTI ratio: 0.76  AORTA Ao Root diam: 2.20 cm MITRAL VALVE MV Area (PHT): 7.25 cm             SHUNTS MV Peak grad:  7.4 mmHg             Systemic VTI:  0.18 m MV Mean grad:  4.0 mmHg             Systemic Diam: 1.50 cm MV Vmax:       1.36 m/s MV Vmean:      94.2 cm/s MV VTI:  0.20 m MV PHT:        30.35 msec MV Decel Time: 105 msec MV E velocity: 133.00 cm/s 103 cm/s  Julien Nordmann MD Electronically signed by Julien Nordmann MD Signature Date/Time: 11/10/2019/4:22:48 PM    Final    US Abdomen Limited RUQ  Result Date: 11/08/2019 CLINICAL DATA:  Right upper quadrant pain with fever for 2 weeks. EXAM: ULTRASOUND ABDOMEN LIMITED RIGHT UPPER QUADRANT COMPARISON:  None. FINDINGS: Gallbladder: No gallstones or wall thickening visualized. No sonographic Murphy sign noted by sonographer. Common bile duct: Diameter: 2 mm Liver: No focal lesion identified. Within normal limits in parenchymal echogenicity. Portal vein is patent on color Doppler imaging with normal direction of blood flow towards the liver. Other: None. IMPRESSION: Normal right upper quadrant ultrasound. Electronically Signed   By: Amie Portland M.D.   On: 11/08/2019 20:23       Subjective:   Discharge Exam: Vitals:    11/13/19 0834 11/13/19 1630  BP: 128/76 (!) 148/90  Pulse: (!) 109 (!) 123  Resp: 18 18  Temp: 98.6 F (37 C) 98.8 F (37.1 C)  SpO2: 96% 98%   Vitals:   11/13/19 0303 11/13/19 0542 11/13/19 0834 11/13/19 1630  BP:  140/84 128/76 (!) 148/90  Pulse: (!) 104 (!) 124 (!) 109 (!) 123  Resp:   18 18  Temp:  100 F (37.8 C) 98.6 F (37 C) 98.8 F (37.1 C)  TempSrc:  Oral Oral Oral  SpO2:  96% 96% 98%  Weight:      Height:            The results of significant diagnostics from this hospitalization (including imaging, microbiology, ancillary and laboratory) are listed below for reference.     Microbiology: No results found for this or any previous visit (from the past 240 hour(s)).   Labs: BNP (last 3 results) No results for input(s): BNP in the last 8760 hours. Basic Metabolic Panel: No results for input(s): NA, K, CL, CO2, GLUCOSE, BUN, CREATININE, CALCIUM, MG, PHOS in the last 168 hours. Liver Function Tests: No results for input(s): AST, ALT, ALKPHOS, BILITOT, PROT, ALBUMIN in the last 168 hours. No results for input(s): LIPASE, AMYLASE in the last 168 hours. No results for input(s): AMMONIA in the last 168 hours. CBC: No results for input(s): WBC, NEUTROABS, HGB, HCT, MCV, PLT in the last 168 hours. Cardiac Enzymes: No results for input(s): CKTOTAL, CKMB, CKMBINDEX, TROPONINI in the last 168 hours. BNP: Invalid input(s): POCBNP CBG: No results for input(s): GLUCAP in the last 168 hours. D-Dimer No results for input(s): DDIMER in the last 72 hours. Hgb A1c No results for input(s): HGBA1C in the last 72 hours. Lipid Profile No results for input(s): CHOL, HDL, LDLCALC, TRIG, CHOLHDL, LDLDIRECT in the last 72 hours. Thyroid function studies No results for input(s): TSH, T4TOTAL, T3FREE, THYROIDAB in the last 72 hours.  Invalid input(s): FREET3 Anemia work up No results for input(s): VITAMINB12, FOLATE, FERRITIN, TIBC, IRON, RETICCTPCT in the last 72  hours. Urinalysis    Component Value Date/Time   COLORURINE AMBER (A) 11/08/2019 1924   APPEARANCEUR HAZY (A) 11/08/2019 1924   LABSPEC 1.028 11/08/2019 1924   PHURINE 6.0 11/08/2019 1924   GLUCOSEU NEGATIVE 11/08/2019 1924   HGBUR NEGATIVE 11/08/2019 1924   BILIRUBINUR NEGATIVE 11/08/2019 1924   KETONESUR 5 (A) 11/08/2019 1924   PROTEINUR 100 (A) 11/08/2019 1924   NITRITE NEGATIVE 11/08/2019 1924   LEUKOCYTESUR NEGATIVE 11/08/2019 1924   Sepsis Labs Invalid  input(s): PROCALCITONIN,  WBC,  LACTICIDVEN Microbiology No results found for this or any previous visit (from the past 240 hour(s)).   Time coordinating discharge: Over 30 minutes  SIGNED:   Lynn Ito, MD  [facility] 11/27/2019, 5:55 PM Pager   If 7PM-7AM, please contact night-coverage [email or web address] Password [email or web address]

## 2019-12-26 ENCOUNTER — Ambulatory Visit: Payer: Self-pay | Admitting: Gastroenterology

## 2020-10-29 IMAGING — DX DG CHEST 1V PORT
1 series · 1 of 1 positions shown · non-contrast
Comparison: 07/18/2008

CLINICAL DATA: FEVER

EXAM:
PORTABLE CHEST 1 VIEW

[chest ap]
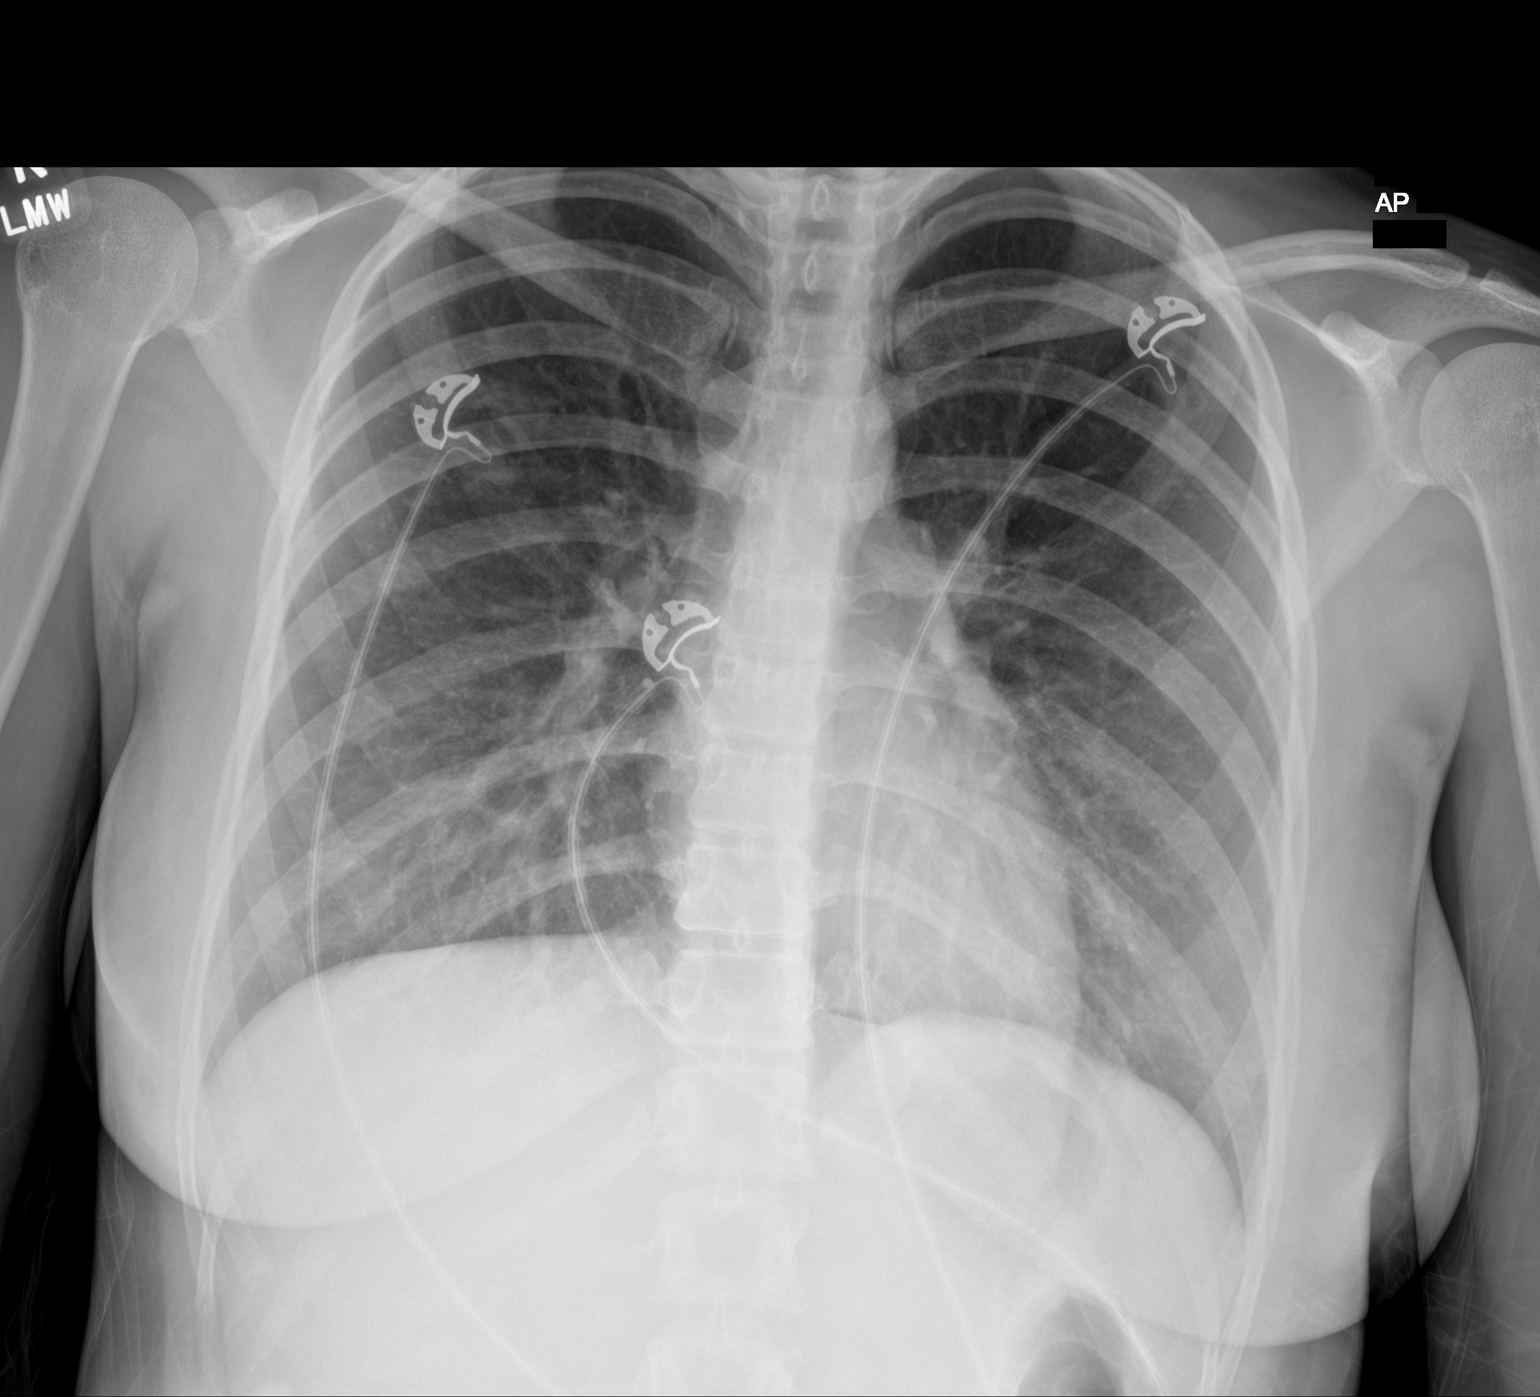

[1 of 1 positions shown; findings below may reference images not displayed]

FINDINGS: The heart size and mediastinal contours are within normal limits.
Both lungs are clear. The visualized skeletal structures are
unremarkable.
IMPRESSION: No active disease.

## 2022-10-06 LAB — HSV DNA BY PCR (REFERENCE LAB)
HSV 1 DNA: NEGATIVE
HSV 2 DNA: NEGATIVE
